# Patient Record
Sex: Female | Born: 1985 | Race: White | Hispanic: No | Marital: Married | Smoking: Never smoker
Health system: Southern US, Community
[De-identification: ages and names within clinical notes are randomized; demographics above are authoritative.]

## PROBLEM LIST (undated history)

## (undated) DIAGNOSIS — Z5189 Encounter for other specified aftercare: Secondary | ICD-10-CM

## (undated) DIAGNOSIS — K219 Gastro-esophageal reflux disease without esophagitis: Secondary | ICD-10-CM

## (undated) DIAGNOSIS — D649 Anemia, unspecified: Secondary | ICD-10-CM

## (undated) DIAGNOSIS — F419 Anxiety disorder, unspecified: Secondary | ICD-10-CM

## (undated) DIAGNOSIS — R011 Cardiac murmur, unspecified: Secondary | ICD-10-CM

## (undated) HISTORY — DX: Gastro-esophageal reflux disease without esophagitis: K21.9

## (undated) HISTORY — DX: Cardiac murmur, unspecified: R01.1

## (undated) HISTORY — DX: Anxiety disorder, unspecified: F41.9

---

## 2008-09-14 HISTORY — PX: BREAST SURGERY: SHX581

## 2016-12-10 ENCOUNTER — Ambulatory Visit (INDEPENDENT_AMBULATORY_CARE_PROVIDER_SITE_OTHER): Payer: 59 | Admitting: Family Medicine

## 2016-12-10 ENCOUNTER — Encounter: Payer: Self-pay | Admitting: Family Medicine

## 2016-12-10 ENCOUNTER — Encounter (INDEPENDENT_AMBULATORY_CARE_PROVIDER_SITE_OTHER): Payer: Self-pay

## 2016-12-10 VITALS — BP 118/78 | HR 78 | Temp 98.9°F | Resp 16 | Ht 66.0 in | Wt 221.1 lb

## 2016-12-10 DIAGNOSIS — F411 Generalized anxiety disorder: Secondary | ICD-10-CM | POA: Diagnosis not present

## 2016-12-10 DIAGNOSIS — Z7689 Persons encountering health services in other specified circumstances: Secondary | ICD-10-CM

## 2016-12-10 DIAGNOSIS — K219 Gastro-esophageal reflux disease without esophagitis: Secondary | ICD-10-CM | POA: Insufficient documentation

## 2016-12-10 DIAGNOSIS — E663 Overweight: Secondary | ICD-10-CM | POA: Diagnosis not present

## 2016-12-10 MED ORDER — PANTOPRAZOLE SODIUM 40 MG PO TBEC: 40.0000 mg | DELAYED_RELEASE_TABLET | Freq: Every day | ORAL | 3 refills | Status: DC

## 2016-12-10 NOTE — Progress Notes (Signed)
Chief Complaint  Patient presents with  . Establish Care   New to establish Complains of chronic GERD Didn't respond to omeprazole or ranitidine Desires referral to GI  Up to date with TdaP and PE in December.  Pap in 2016. Needs yearly flu shots, discussed Recently started exercise to lose weight Desires fertility.  Married about a year ago.   Patient Active Problem List   Diagnosis Date Noted  . Anxiety, generalized 12/10/2016  . Acid reflux 12/10/2016  . Overweight 12/10/2016    Outpatient Encounter Prescriptions as of 12/10/2016  Medication Sig  . sertraline (ZOLOFT) 50 MG tablet Take 50 mg by mouth daily.  . pantoprazole (PROTONIX) 40 MG tablet Take 1 tablet (40 mg total) by mouth daily.   No facility-administered encounter medications on file as of 12/10/2016.     Past Medical History:  Diagnosis Date  . Anxiety   . GERD (gastroesophageal reflux disease)   . Heart murmur     Past Surgical History:  Procedure Laterality Date  . BREAST SURGERY  2010   reduction    Social History   Social History  . Marital status: Married    Spouse name: Ethelene Browns  . Number of children: 0  . Years of education: 14   Occupational History  . branch Psychologist, [employer] and [employer]   Social History Main Topics  . Smoking status: Never Smoker  . Smokeless tobacco: Never Used  . Alcohol use Yes     Comment: occasionally  . Drug use: No  . Sexual activity: Yes    Birth control/ protection: None     Comment: pregnancy OK   Other Topics Concern  . 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 Asthma Mother     childhood  . Cancer Mother     skin cancer  . Hyperlipidemia Father   . Arthritis Maternal Grandmother   . Glaucoma Maternal Grandmother   . Heart disease Maternal Grandfather   . Hyperlipidemia  Maternal Grandfather   . Hypertension Maternal Grandfather   . Stroke Maternal Grandfather   . Cancer Maternal Grandfather     skin  . Cancer Paternal Grandmother     pancreatic  . Cancer Paternal Grandfather 40    Review of Systems  Constitutional: Negative for chills, fever and weight loss.  HENT: Negative for congestion and hearing loss.   Eyes: Negative for blurred vision and pain.  Respiratory: Negative for cough and shortness of breath.   Cardiovascular: Negative for chest pain and leg swelling.  Gastrointestinal: Positive for heartburn. Negative for abdominal pain, constipation and diarrhea.  Genitourinary: Negative for dysuria and frequency.  Musculoskeletal: Negative for falls, joint pain and myalgias.  Neurological: Negative for dizziness, seizures and headaches.  Psychiatric/Behavioral: Negative for depression. The patient is not nervous/anxious and does not have insomnia.     BP 118/78 (BP Location: Right Arm, Patient Position: Sitting, Cuff Size: Normal)   Pulse 78   Temp 98.9 F (37.2 C) (Temporal)   Resp 16   Ht 5\' 6"  (1.676 m)   Wt 221 lb 1.3 oz (100.3 kg)   LMP 12/10/2016 (Exact Date)   SpO2 99%   BMI 35.68 kg/m   Physical Exam  Constitutional: She is  oriented to person, place, and time. She appears well-developed and well-nourished.  HENT:  Head: Normocephalic and atraumatic.  Right Ear: External ear normal.  Left Ear: External ear normal.  Mouth/Throat: Oropharynx is clear and moist.  Eyes: Conjunctivae are normal. Pupils are equal, round, and reactive to light.  Neck: Normal range of motion. Neck supple. No thyromegaly present.  Cardiovascular: Normal rate, regular rhythm and normal heart sounds.   Pulmonary/Chest: Effort normal and breath sounds normal. No respiratory distress.  Abdominal: Soft. Bowel sounds are normal.  Musculoskeletal: Normal range of motion. She exhibits no edema.  Lymphadenopathy:    She has no cervical adenopathy.    Neurological: She is alert and oriented to person, place, and time.  Gait normal  Skin: Skin is warm and dry.  Psychiatric: She has a normal mood and affect. Her behavior is normal. Thought content normal.  Nursing note and vitals reviewed.  ASSESSMENT/PLAN:  1. Anxiety, generalized Continue zoloft  2. Gastroesophageal reflux disease, esophagitis presence not specified Trial of protonix - Ambulatory referral to Gastroenterology  3. Overweight Diet and exercise discussed with goal to lose 1-2 pounds a week.  4. Encounter to establish care with new doctor - CBC - Comprehensive metabolic panel - Lipid panel - VITAMIN D 25 Hydroxy (Vit-D Deficiency, Fractures) - Urinalysis, Routine w reflex microscopic   Patient Instructions  I have placed referral to GI doctor for acid reflux I have prescribed protonix (pantoprazole) to take once a day Take on empty stomach in the morning  Continue to eat well and exercise  I do recommend yearly flu shots Need physical and labs in December  Call sooner for problems  Need old records , please   Eustace MooreYvonne Sue Candy Leverett, MD

## 2016-12-10 NOTE — Patient Instructions (Addendum)
I have placed referral to GI doctor for acid reflux I have prescribed protonix (pantoprazole) to take once a day Take on empty stomach in the morning  Continue to eat well and exercise  I do recommend yearly flu shots Need physical and labs in December  Call sooner for problems  Need old records , please

## 2016-12-21 ENCOUNTER — Encounter: Payer: Self-pay | Admitting: Internal Medicine

## 2017-01-06 ENCOUNTER — Other Ambulatory Visit: Payer: Self-pay

## 2017-01-06 ENCOUNTER — Encounter: Payer: Self-pay | Admitting: Nurse Practitioner

## 2017-01-06 ENCOUNTER — Ambulatory Visit (INDEPENDENT_AMBULATORY_CARE_PROVIDER_SITE_OTHER): Payer: 59 | Admitting: Nurse Practitioner

## 2017-01-06 VITALS — BP 129/73 | HR 71 | Temp 98.4°F | Ht 66.5 in | Wt 226.6 lb

## 2017-01-06 DIAGNOSIS — K5900 Constipation, unspecified: Secondary | ICD-10-CM | POA: Diagnosis not present

## 2017-01-06 DIAGNOSIS — K219 Gastro-esophageal reflux disease without esophagitis: Secondary | ICD-10-CM

## 2017-01-06 DIAGNOSIS — R1310 Dysphagia, unspecified: Secondary | ICD-10-CM | POA: Diagnosis not present

## 2017-01-06 NOTE — Progress Notes (Signed)
Primary Care Physician:  Eustace Moore, MD Primary Gastroenterologist:  Dr. Jena Gauss  Chief Complaint  Patient presents with  . Gastroesophageal Reflux  . Constipation    some blood when wipes  . Abdominal Pain    mid upper abd  . Nausea    HPI:   Rachel Roy is a 31 y.o. female who presents on referral from primary care for GERD. The patient was last seen by PCP on 12/10/2016. At that time she was establishing care with a new primary care and complained of chronic GERD symptoms not responding to omeprazole or ranitidine. She was given a trial of Protonix and referred to GI. No record of colonoscopy or endoscopy in our system.  Today she states she's doing well overall. Protonix is working better, but not 100% effective and becomes symptomatic if she misses a day. Has identified some triggers such as coffee which causes breakthrough even if she takes PPI: coffee, ETOH, fried food. Rarely takes NSAIDs; denies ASA powders. Reflux symptoms include persistently clearing her throat, esophageal burning and regurgitation,epigastric pain; worse with laying flat or bending forward. Has solid food dysphagia, no pill dysphagia. Eats last meal of the day around 6 pm, lays down by 9 pm. Symptoms also with nausea. Has constipation and bloating, bowel movement about every 2-3 days; feels like she needs to go "all the time but can't." When she does have a BM stools are hard and require straining. Occasionally takes MiraLAX which isn't very effective. Has a lot of stress in her life. Denies lower abdominal pain. Notes scant toilet tissue hematochezia 8/10 times if she has constipation and hard stools. Has been told she has hemorrhoids and when she has a hard stool she has rectal irritation and pain. Denies chest pain, dyspnea, dizziness, lightheadedness, syncope, near syncope. Denies any other upper or lower GI symptoms.  Past Medical History:  Diagnosis Date  . Anxiety   . GERD (gastroesophageal  reflux disease)   . Heart murmur     Past Surgical History:  Procedure Laterality Date  . BREAST SURGERY  2010   reduction    Current Outpatient Prescriptions  Medication Sig Dispense Refill  . pantoprazole (PROTONIX) 40 MG tablet Take 1 tablet (40 mg total) by mouth daily. 30 tablet 3  . sertraline (ZOLOFT) 50 MG tablet Take 50 mg by mouth daily.     No current facility-administered medications for this visit.     Allergies as of 01/06/2017  . (No Known Allergies)    Family History  Problem Relation Age of Onset  . Asthma Mother     childhood  . Cancer Mother     skin cancer  . Hyperlipidemia Father   . Arthritis Maternal Grandmother   . Glaucoma Maternal Grandmother   . Heart disease Maternal Grandfather   . Hyperlipidemia Maternal Grandfather   . Hypertension Maternal Grandfather   . Stroke Maternal Grandfather   . Cancer Maternal Grandfather     skin  . Cancer Paternal Grandmother     pancreatic  . Cancer Paternal Grandfather 50  . Colon cancer Neg Hx     Social History   Social History  . Marital status: Married    Spouse name: Ethelene Browns  . Number of children: 0  . Years of education: 14   Occupational History  . branch Psychologist, educational and trust   Social History Main Topics  . Smoking status: Never Smoker  . Smokeless tobacco: Never Used  .  Alcohol use Yes     Comment: occasionally  . Drug use: No  . Sexual activity: Yes    Birth control/ protection: None     Comment: pregnancy OK   Other Topics Concern  . 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 ENT: Negative for hoarseness, difficulty swallowing. CV: Negative for chest pain, angina, palpitations, peripheral edema.  Respiratory: Negative for dyspnea at rest, cough, sputum,  wheezing.  GI: See history of present illness.  MS: Negative for joint pain, low back pain.  Derm: Negative for rash or itching.  Endo: Negative for unusual weight change.  Heme: Negative for bruising or bleeding. Allergy: Negative for rash or hives.    Physical Exam: BP 129/73   Pulse 71   Temp 98.4 F (36.9 C) (Oral)   Ht 5' 6.5" (1.689 m)   Wt 226 lb 9.6 oz (102.8 kg)   LMP 12/10/2016 (Exact Date)   BMI 36.03 kg/m  General:   Alert and oriented. Pleasant and cooperative. Well-nourished and well-developed.  Head:  Normocephalic and atraumatic. Eyes:  Without icterus, sclera clear and conjunctiva pink.  Ears:  Normal auditory acuity. Cardiovascular:  S1, S2 present without murmurs appreciated. Extremities without clubbing or edema. Respiratory:  Clear to auscultation bilaterally. No wheezes, rales, or rhonchi. No distress.  Gastrointestinal:  +BS, rounded but soft, non-tender and non-distended. No HSM noted. No guarding or rebound. No masses appreciated.  Rectal:  Deferred  Musculoskalatal:  Symmetrical without gross deformities. Neurologic:  Alert and oriented x4;  grossly normal neurologically. Psych:  Alert and cooperative. Normal mood and affect. Heme/Lymph/Immune: No excessive bruising noted.    01/06/2017 8:41 AM   Disclaimer: This note was dictated with voice recognition software. Similar sounding words can inadvertently be transcribed and may not be corrected upon review.

## 2017-01-06 NOTE — Progress Notes (Signed)
cc'ed to pcp °

## 2017-01-06 NOTE — Patient Instructions (Addendum)
1. Stop taking Protonix for now. 2. Stop taking MiraLAX and all other laxatives for now. 3. I'm giving the samples of Dexilant 60 mg. Take this once a day, on an empty stomach, first thing in the morning. 4. I am also giving you Amitiza 24 g. Take this twice a day, with food to prevent nausea. 5. I'm giving you samples to last 2 weeks for both. Call us in 1-2 weeks and let us know how both of these medicines are working. 6. We will schedule your upper endoscopy with possible dilation for you. 7. Return for follow-up in 3 months. 8. If your constipation improves which are still having some blood with her bowel movements we may need to discuss colonoscopy     Heartburn Heartburn is a type of pain or discomfort that can happen in the throat or chest. It is often described as a burning pain. It may also cause a bad taste in the mouth. Heartburn may feel worse when you lie down or bend over, and it is often worse at night. Heartburn may be caused by stomach contents that move back up into the esophagus (reflux). Follow these instructions at home: Take these actions to decrease your discomfort and to help avoid complications. Diet   Follow a diet as recommended by your health care provider. This may involve avoiding foods and drinks such as:  Coffee and tea (with or without caffeine).  Drinks that contain alcohol.  Energy drinks and sports drinks.  Carbonated drinks or sodas.  Chocolate and cocoa.  Peppermint and mint flavorings.  Garlic and onions.  Horseradish.  Spicy and acidic foods, including peppers, chili powder, curry powder, vinegar, hot sauces, and barbecue sauce.  Citrus fruit juices and citrus fruits, such as oranges, lemons, and limes.  Tomato-based foods, such as red sauce, chili, salsa, and pizza with red sauce.  Fried and fatty foods, such as donuts, french fries, potato chips, and high-fat dressings.  High-fat meats, such as hot dogs and fatty cuts of red and  white meats, such as rib eye steak, sausage, ham, and bacon.  High-fat dairy items, such as whole milk, butter, and cream cheese.  Eat small, frequent meals instead of large meals.  Avoid drinking large amounts of liquid with your meals.  Avoid eating meals during the 2-3 hours before bedtime.  Avoid lying down right after you eat.  Do not exercise right after you eat. General instructions   Pay attention to any changes in your symptoms.  Take over-the-counter and prescription medicines only as told by your health care provider. Do not take aspirin, ibuprofen, or other NSAIDs unless your health care provider told you to do so.  Do not use any tobacco products, including cigarettes, chewing tobacco, and e-cigarettes. If you need help quitting, ask your health care provider.  Wear loose-fitting clothing. Do not wear anything tight around your waist that causes pressure on your abdomen.  Raise (elevate) the head of your bed about 6 inches (15 cm).  Try to reduce your stress, such as with yoga or meditation. If you need help reducing stress, ask your health care provider.  If you are overweight, reduce your weight to an amount that is healthy for you. Ask your health care provider for guidance about a safe weight loss goal.  Keep all follow-up visits as told by your health care provider. This is important. Contact a health care provider if:  You have new symptoms.  You have unexplained weight loss.  You  have difficulty swallowing, or it hurts to swallow.  You have wheezing or a persistent cough.  Your symptoms do not improve with treatment.  You have frequent heartburn for more than two weeks. Get help right away if:  You have pain in your arms, neck, jaw, teeth, or back.  You feel sweaty, dizzy, or light-headed.  You have chest pain or shortness of breath.  You vomit and your vomit looks like blood or coffee grounds.  Your stool is bloody or black. This information  is not intended to replace advice given to you by your health care provider. Make sure you discuss any questions you have with your health care provider. Document Released: 01/17/2009 Document Revised: 02/06/2016 Document Reviewed: 12/26/2014 Elsevier Interactive Patient Education  2017 [employer].     Food Choices for Gastroesophageal Reflux Disease, Adult When you have gastroesophageal reflux disease (GERD), the foods you eat and your eating habits are very important. Choosing the right foods can help ease the discomfort of GERD. Consider working with a diet and nutrition specialist (dietitian) to help you make healthy food choices. What general guidelines should I follow? Eating plan   Choose healthy foods low in fat, such as fruits, vegetables, whole grains, low-fat dairy products, and lean meat, fish, and poultry.  Eat frequent, small meals instead of three large meals each day. Eat your meals slowly, in a relaxed setting. Avoid bending over or lying down until 2-3 hours after eating.  Limit high-fat foods such as fatty meats or fried foods.  Limit your intake of oils, butter, and shortening to less than 8 teaspoons each day.  Avoid the following:  Foods that cause symptoms. These may be different for different people. Keep a food diary to keep track of foods that cause symptoms.  Alcohol.  Drinking large amounts of liquid with meals.  Eating meals during the 2-3 hours before bed.  Cook foods using methods other than frying. This may include baking, grilling, or broiling. Lifestyle    Maintain a healthy weight. Ask your health care provider what weight is healthy for you. If you need to lose weight, work with your health care provider to do so safely.  Exercise for at least 30 minutes on 5 or more days each week, or as told by your health care provider.  Avoid wearing clothes that fit tightly around your waist and chest.  Do not use any products that contain nicotine  or tobacco, such as cigarettes and e-cigarettes. If you need help quitting, ask your health care provider.  Sleep with the head of your bed raised. Use a wedge under the mattress or blocks under the bed frame to raise the head of the bed. What foods are not recommended? The items listed may not be a complete list. Talk with your dietitian about what dietary choices are best for you. Grains  Pastries or quick breads with added fat. [place] toast. Vegetables  Deep fried vegetables. [place] fries. Any vegetables prepared with added fat. Any vegetables that cause symptoms. For some people this may include tomatoes and tomato products, chili peppers, onions and garlic, and horseradish. Fruits  Any fruits prepared with added fat. Any fruits that cause symptoms. For some people this may include citrus fruits, such as oranges, grapefruit, pineapple, and lemons. Meats and other protein foods  High-fat meats, such as fatty beef or pork, hot dogs, ribs, ham, sausage, salami and bacon. Fried meat or protein, including fried fish and fried chicken. Nuts and nut butters.  Dairy  Whole milk and chocolate milk. Sour cream. Cream. Ice cream. Cream cheese. Milk shakes. Beverages  Coffee and tea, with or without caffeine. Carbonated beverages. Sodas. Energy drinks. Fruit juice made with acidic fruits (such as orange or grapefruit). Tomato juice. Alcoholic drinks. Fats and oils  Butter. Margarine. Shortening. Ghee. Sweets and desserts  Chocolate and cocoa. Donuts. Seasoning and other foods  Pepper. Peppermint and spearmint. Any condiments, herbs, or seasonings that cause symptoms. For some people, this may include curry, hot sauce, or vinegar-based salad dressings. Summary  When you have gastroesophageal reflux disease (GERD), food and lifestyle choices are very important to help ease the discomfort of GERD.  Eat frequent, small meals instead of three large meals each day. Eat your meals slowly, in a relaxed  setting. Avoid bending over or lying down until 2-3 hours after eating.  Limit high-fat foods such as fatty meat or fried foods. This information is not intended to replace advice given to you by your health care provider. Make sure you discuss any questions you have with your health care provider. Document Released: 08/31/2005 Document Revised: 09/01/2016 Document Reviewed: 09/01/2016 Elsevier Interactive Patient Education  2017 [employer].

## 2017-01-18 ENCOUNTER — Telehealth: Payer: Self-pay | Admitting: Internal Medicine

## 2017-01-18 DIAGNOSIS — K219 Gastro-esophageal reflux disease without esophagitis: Secondary | ICD-10-CM

## 2017-01-18 DIAGNOSIS — K5904 Chronic idiopathic constipation: Secondary | ICD-10-CM

## 2017-01-18 NOTE — Telephone Encounter (Signed)
Pt was seen recently and given samples of Amitiza and Dexilant. [facility] is [facility].

## 2017-01-18 NOTE — Telephone Encounter (Signed)
Routing to EG 

## 2017-01-19 MED ORDER — LUBIPROSTONE 24 MCG PO CAPS: 24.0000 ug | ORAL_CAPSULE | Freq: Two times a day (BID) | ORAL | 3 refills | Status: DC

## 2017-01-19 MED ORDER — DEXLANSOPRAZOLE 60 MG PO CPDR
60.0000 mg | DELAYED_RELEASE_CAPSULE | Freq: Every day | ORAL | 3 refills | Status: DC
Start: 2017-01-19 — End: 2017-03-19

## 2017-01-19 NOTE — Telephone Encounter (Signed)
Please tell the patient Rx for both sent to the pharmacy as per patient request.

## 2017-01-19 NOTE — Addendum Note (Signed)
Addended by: Delane GingerGILL, Cerys Winget A on: 01/19/2017 05:44 PM   Modules accepted: Orders

## 2017-01-20 NOTE — Telephone Encounter (Signed)
I tried to call the patient, no answer,lmom 

## 2017-01-20 NOTE — Telephone Encounter (Signed)
Pt called back and she is aware the the RX has been called into the pharmacy

## 2017-01-25 ENCOUNTER — Telehealth: Payer: Self-pay

## 2017-01-25 MED ORDER — SERTRALINE HCL 50 MG PO TABS: 50.0000 mg | ORAL_TABLET | Freq: Every day | ORAL | 1 refills | Status: DC

## 2017-01-25 NOTE — Telephone Encounter (Signed)
Wants refill on zoloft

## 2017-02-09 NOTE — Patient Instructions (Signed)
Rachel Roy  02/09/2017     @PREFPERIOPPHARMACY @   Your procedure is scheduled on 02/18/2017.  Report to [facility] at 8:15 A.M.  Call this number if you have problems the morning of surgery:  [phone]   Remember:  Do not eat food or drink liquids after midnight.  Take these medicines the morning of surgery with A SIP OF WATER Dexilant, OR Protonix, Zoloft, Amtiza   Do not wear jewelry, make-up or nail polish.  Do not wear lotions, powders, or perfumes, or deoderant.  Do not shave 48 hours prior to surgery.  Men may shave face and neck.  Do not bring valuables to the hospital.  [facility] is not responsible for any belongings or valuables.  Contacts, dentures or bridgework may not be worn into surgery.  Leave your suitcase in the car.  After surgery it may be brought to your room.  For patients admitted to the hospital, discharge time will be determined by your treatment team.  Patients discharged the day of surgery will not be allowed to drive home.    Please read over the following fact sheets that you were given. Anesthesia Post-op Instructions     PATIENT INSTRUCTIONS POST-ANESTHESIA  IMMEDIATELY FOLLOWING SURGERY:  Do not drive or operate machinery for the first twenty four hours after surgery.  Do not make any important decisions for twenty four hours after surgery or while taking narcotic pain medications or sedatives.  If you develop intractable nausea and vomiting or a severe headache please notify your doctor immediately.  FOLLOW-UP:  Please make an appointment with your surgeon as instructed. You do not need to follow up with anesthesia unless specifically instructed to do so.  WOUND CARE INSTRUCTIONS (if applicable):  Keep a dry clean dressing on the anesthesia/puncture wound site if there is drainage.  Once the wound has quit draining you may leave it open to air.  Generally you should leave the bandage intact for twenty four hours unless there is  drainage.  If the epidural site drains for more than 36-48 hours please call the anesthesia department.  QUESTIONS?:  Please feel free to call your physician or the hospital operator if you have any questions, and they will be happy to assist you.      Esophagogastroduodenoscopy Esophagogastroduodenoscopy (EGD) is a procedure to examine the lining of the esophagus, stomach, and first part of the small intestine (duodenum). This procedure is done to check for problems such as inflammation, bleeding, ulcers, or growths. During this procedure, a long, flexible, lighted tube with a camera attached (endoscope) is inserted down the throat. Tell a health care provider about:  Any allergies you have.  All medicines you are taking, including vitamins, herbs, eye drops, creams, and over-the-counter medicines.  Any problems you or family members have had with anesthetic medicines.  Any blood disorders you have.  Any surgeries you have had.  Any medical conditions you have.  Whether you are pregnant or may be pregnant. What are the risks? Generally, this is a safe procedure. However, problems may occur, including:  Infection.  Bleeding.  A tear (perforation) in the esophagus, stomach, or duodenum.  Trouble breathing.  Excessive sweating.  Spasms of the larynx.  A slowed heartbeat.  Low blood pressure. What happens before the procedure?  Follow instructions from your health care provider about eating or drinking restrictions.  Ask your health care provider about:  Changing or stopping your regular medicines. This is especially important if you are  taking diabetes medicines or blood thinners.  Taking medicines such as aspirin and ibuprofen. These medicines can thin your blood. Do not take these medicines before your procedure if your health care provider instructs you not to.  Plan to have someone take you home after the procedure.  If you wear dentures, be ready to remove them  before the procedure. What happens during the procedure?  To reduce your risk of infection, your health care team will wash or sanitize their hands.  An IV tube will be put in a vein in your hand or arm. You will get medicines and fluids through this tube.  You will be given one or more of the following:  A medicine to help you relax (sedative).  A medicine to numb the area (local anesthetic). This medicine may be sprayed into your throat. It will make you feel more comfortable and keep you from gagging or coughing during the procedure.  A medicine for pain.  A mouth guard may be placed in your mouth to protect your teeth and to keep you from biting on the endoscope.  You will be asked to lie on your left side.  The endoscope will be lowered down your throat into your esophagus, stomach, and duodenum.  Air will be put into the endoscope. This will help your health care provider see better.  The lining of your esophagus, stomach, and duodenum will be examined.  Your health care provider may:  Take a tissue sample so it can be looked at in a lab (biopsy).  Remove growths.  Remove objects (foreign bodies) that are stuck.  Treat any bleeding with medicines or other devices that stop tissue from bleeding.  Widen (dilate) or stretch narrowed areas of your esophagus and stomach.  The endoscope will be taken out. The procedure may vary among health care providers and hospitals. What happens after the procedure?  Your blood pressure, heart rate, breathing rate, and blood oxygen level will be monitored often until the medicines you were given have worn off.  Do not eat or drink anything until the numbing medicine has worn off and your gag reflex has returned. This information is not intended to replace advice given to you by your health care provider. Make sure you discuss any questions you have with your health care provider. Document Released: 01/01/2005 Document Revised:  02/06/2016 Document Reviewed: 07/25/2015 Elsevier Interactive Patient Education  2017 [employer] Esophageal Dilatation Esophageal dilatation is a procedure to open a blocked or narrowed part of the esophagus. The esophagus is the long tube in your throat that carries food and liquid from your mouth to your stomach. The procedure is also called esophageal dilation. You may need this procedure if you have a buildup of scar tissue in your esophagus that makes it difficult, painful, or even impossible to swallow. This can be caused by gastroesophageal reflux disease (GERD). In rare cases, people need this procedure because they have cancer of the esophagus or a problem with the way food moves through the esophagus. Sometimes you may need to have another dilatation to enlarge the opening of the esophagus gradually. Tell a health care provider about:  Any allergies you have.  All medicines you are taking, including vitamins, herbs, eye drops, creams, and over-the-counter medicines.  Any problems you or family members have had with anesthetic medicines.  Any blood disorders you have.  Any surgeries you have had.  Any medical conditions you have.  Any antibiotic medicines you are required to  take before dental procedures. What are the risks? Generally, this is a safe procedure. However, problems can occur and include:  Bleeding from a tear in the lining of the esophagus.  A hole (perforation) in the esophagus. What happens before the procedure?  Do not eat or drink anything after midnight on the night before the procedure or as directed by your health care provider.  Ask your health care provider about changing or stopping your regular medicines. This is especially important if you are taking diabetes medicines or blood thinners.  Plan to have someone take you home after the procedure. What happens during the procedure?  You will be given a medicine that makes you relaxed and sleepy  (sedative).  A medicine may be sprayed or gargled to numb the back of the throat.  Your health care provider can use various instruments to do an esophageal dilatation. During the procedure, the instrument used will be placed in your mouth and passed down into your esophagus. Options include:  Simple dilators. This instrument is carefully placed in the esophagus to stretch it.  Guided wire bougies. In this method, a flexible tube (endoscope) is used to insert a wire into the esophagus. The dilator is passed over this wire to enlarge the esophagus. Then the wire is removed.  Balloon dilators. An endoscope with a small balloon at the end is passed down into the esophagus. Inflating the balloon gently stretches the esophagus and opens it up. What happens after the procedure?  Your blood pressure, heart rate, breathing rate, and blood oxygen level will be monitored often until the medicines you were given have worn off.  Your throat may feel slightly sore and will probably still feel numb. This will improve slowly over time.  You will not be allowed to eat or drink until the throat numbness has resolved.  If this is a same-day procedure, you may be allowed to go home once you have been able to drink, urinate, and sit on the edge of the bed without nausea or dizziness.  If this is a same-day procedure, you should have a friend or family member with you for the next 24 hours after the procedure. This information is not intended to replace advice given to you by your health care provider. Make sure you discuss any questions you have with your health care provider. Document Released: 10/22/2005 Document Revised: 02/06/2016 Document Reviewed: 01/10/2014 Elsevier Interactive Patient Education  2017 [employer].

## 2017-02-11 ENCOUNTER — Telehealth: Payer: Self-pay | Admitting: Internal Medicine

## 2017-02-11 NOTE — Telephone Encounter (Signed)
Called pt. She needs to cancel EGD d/t her husband will be out of town. Informed pt she will need to have OV to reschedule EGD. OV scheduled with AB for 03/19/17. LMOVM and informed Endo scheduler to cancel procedure.

## 2017-02-11 NOTE — Telephone Encounter (Signed)
Noted  

## 2017-02-11 NOTE — Telephone Encounter (Signed)
Routing to EG as FYI since he seen pt at last OV.

## 2017-02-11 NOTE — Telephone Encounter (Signed)
Pt called to reschedule her EGD for 6/7 with [facility]. Please call her back at [phone]

## 2017-02-12 ENCOUNTER — Inpatient Hospital Stay (HOSPITAL_COMMUNITY)
Admission: RE | Admit: 2017-02-12 | Discharge: 2017-02-12 | Disposition: A | Discharge status: Home / Self Care | Payer: 59 | Source: Ambulatory Visit

## 2017-02-16 ENCOUNTER — Other Ambulatory Visit (HOSPITAL_COMMUNITY): Payer: 59

## 2017-02-18 ENCOUNTER — Ambulatory Visit (HOSPITAL_COMMUNITY): Admission: RE | Admit: 2017-02-18 | Payer: 59 | Source: Ambulatory Visit | Admitting: Internal Medicine

## 2017-02-18 ENCOUNTER — Encounter (HOSPITAL_COMMUNITY): Admission: RE | Payer: Self-pay | Source: Ambulatory Visit

## 2017-02-18 SURGERY — ESOPHAGOGASTRODUODENOSCOPY (EGD) WITH PROPOFOL
Anesthesia: Monitor Anesthesia Care

## 2017-03-19 ENCOUNTER — Encounter: Payer: Self-pay | Admitting: Gastroenterology

## 2017-03-19 ENCOUNTER — Ambulatory Visit (INDEPENDENT_AMBULATORY_CARE_PROVIDER_SITE_OTHER): Payer: 59 | Admitting: Gastroenterology

## 2017-03-19 VITALS — BP 124/73 | HR 72 | Temp 97.6°F | Ht 67.0 in | Wt 219.2 lb

## 2017-03-19 DIAGNOSIS — K219 Gastro-esophageal reflux disease without esophagitis: Secondary | ICD-10-CM

## 2017-03-19 DIAGNOSIS — R1013 Epigastric pain: Secondary | ICD-10-CM | POA: Diagnosis not present

## 2017-03-19 MED ORDER — DEXLANSOPRAZOLE 60 MG PO CPDR: 60.0000 mg | DELAYED_RELEASE_CAPSULE | Freq: Every day | ORAL | 11 refills | Status: DC

## 2017-03-19 NOTE — Assessment & Plan Note (Signed)
31 year old female with history of dyspepsia, dysphagia, and now with resolution of symptoms after starting Dexilant and employing strict dietary/behavior modification. She has lost weight purposefully and was applauded on this. Removing gluten from her diet has resulted in significant improvement as well. She is curious about celiac disease, and I don't think it's unreasonable to check serologies as she noted significant improvement with avoiding gluten in her diet; however, the improvement could be related to multiple factors,.  We will hold off on EGD/dilation now as she is doing well. Check celiac serologies. If positive, she would need EGD with small bowel biopsy. Continue Dexilant. Return in 6 months.

## 2017-03-19 NOTE — Progress Notes (Signed)
cc'ed to pcp °

## 2017-03-19 NOTE — Progress Notes (Signed)
Referring Provider: Eustace MooreNelson, Rachel Sue, MD Primary Care Physician:  Eustace MooreNelson, Rachel Sue, MD Primary GI: Dr. Jena Gaussourk   Chief Complaint  Patient presents with  . Gastroesophageal Reflux    reschedule EGD, Dexilant helping, stopped coffee    HPI:   Rachel Roy is a 31 y.o. female presenting today with a history of chronic GERD, dyspepsia, and solid food dysphagia. She had been seen in April 2018 and EGD arranged, but she had to cancel. She is here to update H&P.   Stopped drinking coffee and noticed significant improvement in symptoms. Would feel bloated after eating, so she stopped intake of gluten. Dysphagia resolved. Dypepsia resolved. She has lost about 6-7 lbs since last seen in our office, purposefully. She has been exercising at the gym, swimming, and has just started slim fast for breakfast/lunch with a sensible dinner. She is very excited about the weight loss.   No issues with constipation. No rectal bleeding.   Past Medical History:  Diagnosis Date  . Anxiety   . GERD (gastroesophageal reflux disease)   . Heart murmur     Past Surgical History:  Procedure Laterality Date  . BREAST SURGERY  2010   reduction    Current Outpatient Prescriptions  Medication Sig Dispense Refill  . dexlansoprazole (DEXILANT) 60 MG capsule Take 1 capsule (60 mg total) by mouth daily. 30 capsule 11  . sertraline (ZOLOFT) 50 MG tablet Take 1 tablet (50 mg total) by mouth daily. 90 tablet 1   No current facility-administered medications for this visit.     Allergies as of 03/19/2017  . (No Known Allergies)    Family History  Problem Relation Age of Onset  . Asthma Mother        childhood  . Cancer Mother        skin cancer  . Hyperlipidemia Father   . Arthritis Maternal Grandmother   . Glaucoma Maternal Grandmother   . Heart disease Maternal Grandfather   . Hyperlipidemia Maternal Grandfather   . Hypertension Maternal Grandfather   . Stroke Maternal Grandfather   . Cancer  Maternal Grandfather        skin  . Cancer Paternal Grandmother        pancreatic  . Cancer Paternal Grandfather 540  . Colon cancer Neg Hx     Social History   Social History  . Marital status: Married    Spouse name: Ethelene Brownsnthony  . Number of children: 0  . Years of education: 14   Occupational History  . branch Psychologist, educationalmanager     [employer]   Social History Main Topics  . Smoking status: Never Smoker  . Smokeless tobacco: Never Used  . Alcohol use Yes     Comment: occasionally  . Drug use: No  . Sexual activity: Yes    Birth control/ protection: None     Comment: pregnancy OK   Other Topics Concern  . None   Social History Narrative   Lives with husband Ethelene Brownsnthony   [place]   [place]   2 dogs, [employer]   [employer]    Review of Systems: As mentioned in HPI.   Physical Exam: BP 124/73   Pulse 72   Temp 97.6 F (36.4 C) (Oral)   Ht 5\' 7"  (1.702 m)   Wt 219 lb 3.2 oz (99.4 kg)   LMP 02/12/2017 (Approximate)   BMI 34.33 kg/m  General:   Alert and oriented. No distress  noted. Pleasant and cooperative.  Head:  Normocephalic and atraumatic. Eyes:  Conjuctiva clear without scleral icterus. Mouth:  Oral mucosa pink and moist. Good dentition. No lesions. Abdomen:  +BS, soft, non-tender and non-distended. No rebound or guarding. No HSM or masses noted. Msk:  Symmetrical without gross deformities. Normal posture. Extremities:  Without edema. Neurologic:  Alert and  oriented x4;  grossly normal neurologically. Psych:  Alert and cooperative. Normal mood and affect.

## 2017-03-19 NOTE — Patient Instructions (Signed)
Continue Dexilant once daily.  Continue the great lifestyle changes! Great job!  I have ordered blood work to have done today.   We will see you in 6 months!

## 2017-03-30 LAB — IGA: IgA: 340 mg/dL (ref 81–463)

## 2017-03-30 LAB — TISSUE TRANSGLUTAMINASE, IGA: Tissue Transglutaminase Ab, IgA: 1 U/mL (ref <4)

## 2017-04-06 NOTE — Progress Notes (Signed)
No new recommendations. Continue excellent dietary/behavior modification. Continue Dexilant. We will see her in 6 months.

## 2017-04-07 ENCOUNTER — Ambulatory Visit: Payer: 59 | Admitting: Nurse Practitioner

## 2017-10-08 ENCOUNTER — Ambulatory Visit: Payer: 59 | Admitting: Gastroenterology

## 2017-10-26 ENCOUNTER — Other Ambulatory Visit: Payer: Self-pay

## 2017-10-26 ENCOUNTER — Other Ambulatory Visit (HOSPITAL_COMMUNITY)
Admission: RE | Admit: 2017-10-26 | Discharge: 2017-10-26 | Disposition: A | Discharge status: Home / Self Care | Payer: 59 | Source: Ambulatory Visit | Attending: Family Medicine | Admitting: Family Medicine

## 2017-10-26 ENCOUNTER — Encounter: Payer: Self-pay | Admitting: Family Medicine

## 2017-10-26 ENCOUNTER — Ambulatory Visit (INDEPENDENT_AMBULATORY_CARE_PROVIDER_SITE_OTHER): Payer: 59 | Admitting: Family Medicine

## 2017-10-26 VITALS — BP 120/84 | HR 80 | Temp 97.7°F | Resp 16 | Ht 67.0 in | Wt 231.1 lb

## 2017-10-26 DIAGNOSIS — N979 Female infertility, unspecified: Secondary | ICD-10-CM

## 2017-10-26 DIAGNOSIS — R002 Palpitations: Secondary | ICD-10-CM | POA: Insufficient documentation

## 2017-10-26 DIAGNOSIS — Z124 Encounter for screening for malignant neoplasm of cervix: Secondary | ICD-10-CM | POA: Diagnosis present

## 2017-10-26 DIAGNOSIS — F411 Generalized anxiety disorder: Secondary | ICD-10-CM | POA: Diagnosis not present

## 2017-10-26 DIAGNOSIS — K219 Gastro-esophageal reflux disease without esophagitis: Secondary | ICD-10-CM | POA: Insufficient documentation

## 2017-10-26 DIAGNOSIS — Z8249 Family history of ischemic heart disease and other diseases of the circulatory system: Secondary | ICD-10-CM

## 2017-10-26 DIAGNOSIS — Z0000 Encounter for general adult medical examination without abnormal findings: Secondary | ICD-10-CM | POA: Diagnosis not present

## 2017-10-26 MED ORDER — BUPROPION HCL ER (SR) 100 MG PO TB12: 100.0000 mg | ORAL_TABLET | Freq: Two times a day (BID) | ORAL | 3 refills | Status: DC

## 2017-10-26 NOTE — Progress Notes (Signed)
Chief Complaint  Patient presents with  . Annual Exam   Rachel Roy is here for her annual physical examination today.  In addition to needing a Pap smear, she has multiple issues to discuss with me. First, she is really bothered by anxiety and depression.  She has been on Lexapro, citalopram, Effexor, and sertraline.  She did not like any of these medications, they will made her feel like she was blunted and her feelings and expression, and had decreased sex drive.  We discussed various options.  She did declines need for counseling.  I am going to start her on Wellbutrin and see if this will help lift her spirits without causing side effects.  Discussed the importance of diet exercise and sleep, needs to take care of herself in order to feel better. She complains of increased dyspepsia.  She did not get her Dexilant filled because when she went to the pharmacy it was very expensive.  She is under the care of gastroenterology.  She is advised to call them for follow-up. She has anxiety about is that she and her husband have been married a couple years and have not gotten pregnant.  They have not use birth control.  She recently got a kit to check her basal body temperatures and keep a chart.  I told her this is a good idea.  Actually, if they have not been using birth control for 1 year period of time the need to be referred for an infertility workup. She states that heart disease runs in her family.  Her mother has cautioned her that she needs to get a heart workup.  She is never had any heart problems, heart murmur, or difficulty with exertion or activity.  There is no real heart disease at a young age.  I will get cholesterol values on her.  She states she does have some palpitations when she is upset.  She states that her heart "beats hard".  No dizziness or lightheadedness.  No chest pain or pressure.  This resolves promptly with rest.  We did an EKG on her today and and it is clearly normal.  We  talked about cardiovascular risk factors.  She currently has none, except for being mildly overweight.  She should exercise regularly.  Eat a heart healthy diet. Patient Active Problem List   Diagnosis Date Noted  . Infertility, female 10/26/2017  . Dyspepsia 03/19/2017  . Dysphagia 01/06/2017  . Constipation 01/06/2017  . Anxiety, generalized 12/10/2016  . Acid reflux 12/10/2016  . Overweight 12/10/2016    Outpatient Encounter Medications as of 10/26/2017  Medication Sig  . buPROPion (WELLBUTRIN SR) 100 MG 12 hr tablet Take 1 tablet (100 mg total) by mouth 2 (two) times daily.   No facility-administered encounter medications on file as of 10/26/2017.    No Known Allergies  Review of Systems  Constitutional: Negative for activity change, appetite change and unexpected weight change.  HENT: Negative for congestion, dental problem, postnasal drip and rhinorrhea.   Eyes: Negative for redness and visual disturbance.  Respiratory: Negative for cough and shortness of breath.   Cardiovascular: Positive for palpitations. Negative for chest pain and leg swelling.       Rapid heart when anxious  Gastrointestinal: Negative for abdominal pain, constipation and diarrhea.  Genitourinary: Positive for menstrual problem. Negative for difficulty urinating and frequency.       Periods every 30 - 45 days  Musculoskeletal: Negative for arthralgias and back pain.  Neurological: Negative  for dizziness and headaches.  Psychiatric/Behavioral: Negative for dysphoric mood and sleep disturbance. The patient is nervous/anxious.     Physical Exam  BP 120/84 (BP Location: Right Arm, Patient Position: Sitting, Cuff Size: Normal)   Pulse 80   Temp 97.7 F (36.5 C) (Temporal)   Resp 16   Ht 5' 7"  (1.702 m)   Wt 231 lb 1.3 oz (104.8 kg)   LMP 10/06/2017 (Exact Date)   SpO2 99%   BMI 36.19 kg/m   General Appearance:    Alert, cooperative, no distress, appears stated age.  Pleasant and cooperative.  Mildly  anxious.  Head:    Normocephalic, without obvious abnormality, atraumatic  Eyes:    PERRL, conjunctiva/corneas clear, EOM's intact, fundi    benign, both eyes  Ears:    Normal TM's and external ear canals, both ears  Nose:   Nares normal, septum midline, mucosa normal, no drainage    or sinus tenderness  Throat:   Lips, mucosa, and tongue normal; teeth and gums normal  Neck:   Supple, symmetrical, trachea midline, no adenopathy;    thyroid:  no enlargement/tenderness/nodules; no carotid   bruit or JVD  Back:     Symmetric, no curvature, ROM normal, no CVA tenderness  Lungs:     Clear to auscultation bilaterally, respirations unlabored  Chest Wall:    No tenderness or deformity   Heart:    Regular rate and rhythm, S1 and S2 normal, no murmur, rub   or gallop  Breast Exam:    No tenderness, masses, or nipple abnormality.scarring noted from breast reduction surgery.  Abdomen:     Soft, non-tender, bowel sounds active all four quadrants,    no masses, no organomegaly  Genitalia:    Normal female without lesion, discharge or tenderness. Nulliparous cervix.  Pap obtained.  Bimanual difficult secondary to body habitus, no mass identified.  Extremities:   Extremities normal, atraumatic, no cyanosis or edema  Pulses:   2+ and symmetric all extremities  Skin:   Skin color, texture, turgor normal, no rashes or lesions  Lymph nodes:   Cervical, supraclavicular, and axillary nodes normal  Neurologic:   CNII-XII intact, normal strength, sensation and reflexes    throughout     ASSESSMENT/PLAN:  1. Physical exam, annual No unexpected findings  2. Screening for cervical cancer Pap done - Cytology - PAP  3. Palpitations EKG is normal  4. Anxiety, generalized wellbutrin  5. Gastroesophageal reflux disease, esophagitis presence not specified Patient is continued on home Dexilant because of price.  I recommend she get some over-the-counter omeprazole for her current symptoms.  She can  follow-up with GYN for other questions.  6. Family history of heart disease None at a young age.  Concern to patient.  Will get blood work and EKG.  Patient was reassured.  7. Infertility, female Refer to infertility center   Patient Instructions  I will notify you of your test results Take the wellbutrin once a day for one week, then increase to 2 times a day EKG is normal  I will refer to fertility doctor  See me in a month to check on new medicine      Raylene Everts, MD

## 2017-10-26 NOTE — Patient Instructions (Signed)
I will notify you of your test results Take the wellbutrin once a day for one week, then increase to 2 times a day EKG is normal  I will refer to fertility doctor  See me in a month to check on new medicine

## 2017-10-28 LAB — CYTOLOGY - PAP
Diagnosis: NEGATIVE
HPV: NOT DETECTED

## 2017-11-23 ENCOUNTER — Ambulatory Visit: Payer: 59 | Admitting: Family Medicine

## 2017-11-30 ENCOUNTER — Ambulatory Visit: Payer: 59 | Admitting: Family Medicine

## 2017-12-06 ENCOUNTER — Ambulatory Visit (INDEPENDENT_AMBULATORY_CARE_PROVIDER_SITE_OTHER): Payer: 59 | Admitting: Gastroenterology

## 2017-12-06 ENCOUNTER — Other Ambulatory Visit: Payer: Self-pay

## 2017-12-06 ENCOUNTER — Encounter: Payer: Self-pay | Admitting: Gastroenterology

## 2017-12-06 ENCOUNTER — Telehealth: Payer: Self-pay

## 2017-12-06 VITALS — BP 122/75 | HR 57 | Temp 97.4°F | Ht 66.5 in | Wt 228.4 lb

## 2017-12-06 DIAGNOSIS — K5900 Constipation, unspecified: Secondary | ICD-10-CM

## 2017-12-06 DIAGNOSIS — R1310 Dysphagia, unspecified: Secondary | ICD-10-CM

## 2017-12-06 DIAGNOSIS — R1013 Epigastric pain: Secondary | ICD-10-CM

## 2017-12-06 MED ORDER — DEXLANSOPRAZOLE 60 MG PO CPDR: 60.0000 mg | DELAYED_RELEASE_CAPSULE | Freq: Every day | ORAL | 3 refills | Status: DC

## 2017-12-06 NOTE — Assessment & Plan Note (Signed)
Dilation at time of EGD.  

## 2017-12-06 NOTE — Progress Notes (Signed)
Primary Care Physician:  Eustace Moore, MD Primary GI: Dr. Jena Gauss   Chief Complaint  Patient presents with  . Gastroesophageal Reflux    HPI:   Rachel Roy is a 32 y.o. female presenting today with a history of chronic GERD, last seen in July 2018. At that time, she had lost weight through purposeful dietary and behavior modifications. GERD symptoms were well controlled on Dexilant. Previously has tried omeprazole, ranitidine, Protonix. Stopped drinking coffee last year with great improvement in symptoms.   Last time getting Dexilant refilled was 70$. Went back to omeprazole. Always feels like there is a lump in her throat and constantly clearing her throat. When eating, feels bloated afterward. Omeprazole OTC not helpful. Has a dry cough. Noting solid food dysphagia intermittently. Started drinking coffee again but only a few times a week, one cup. Trying to drink just water. Has nausea but no pain. Has a sour stomach at times, indigestion. Feels uncomfortable.   Constipation: will sometimes go a few days without a BM. Past month or two has been worse. Straining at times. Trying to drink water. Has tried Miralax before, which helped. Doesn't want to try prescription yet.    Past Medical History:  Diagnosis Date  . Anxiety   . GERD (gastroesophageal reflux disease)   . Heart murmur     Past Surgical History:  Procedure Laterality Date  . BREAST SURGERY  2010   reduction    Current Outpatient Medications  Medication Sig Dispense Refill  . buPROPion (WELLBUTRIN SR) 100 MG 12 hr tablet Take 1 tablet (100 mg total) by mouth 2 (two) times daily. 60 tablet 3  . omeprazole (PRILOSEC) 20 MG capsule Take 20 mg by mouth daily.    Marland Kitchen dexlansoprazole (DEXILANT) 60 MG capsule Take 1 capsule (60 mg total) by mouth daily. 90 capsule 3   No current facility-administered medications for this visit.     Allergies as of 12/06/2017  . (No Known Allergies)    Family History    Problem Relation Age of Onset  . Asthma Mother        childhood  . Cancer Mother        skin cancer  . Hyperlipidemia Father   . Arthritis Maternal Grandmother   . Glaucoma Maternal Grandmother   . Heart disease Maternal Grandmother   . Heart disease Maternal Grandfather        died cancer, heart at 48s  . Hyperlipidemia Maternal Grandfather   . Hypertension Maternal Grandfather   . Stroke Maternal Grandfather   . Cancer Maternal Grandfather        skin  . Cancer Paternal Grandmother        pancreatic  . Cancer Paternal Grandfather 59  . Colon cancer Neg Hx     Social History   Socioeconomic History  . Marital status: Married    Spouse name: Ethelene Browns  . Number of children: 0  . Years of education: 86  . Highest education level: Not on file  Occupational History  . Occupation: Data processing manager    Comment: [employer]  Social Needs  . Financial resource strain: Not on file  . Food insecurity:    Worry: Not on file    Inability: Not on file  . Transportation needs:    Medical: Not on file    Non-medical: Not on file  Tobacco Use  . Smoking status: Never Smoker  . Smokeless tobacco: Never Used  Substance and Sexual Activity  .  Alcohol use: Yes    Comment: occasionally  . Drug use: No  . Sexual activity: Yes    Birth control/protection: None    Comment: pregnancy OK  Lifestyle  . Physical activity:    Days per week: Not on file    Minutes per session: Not on file  . Stress: Not on file  Relationships  . Social connections:    Talks on phone: Not on file    Gets together: Not on file    Attends religious service: Not on file    Active member of club or organization: Not on file    Attends meetings of clubs or organizations: Not on file    Relationship status: Not on file  Other Topics Concern  . Not on file  Social History Narrative   Lives with husband Ethelene Brownsnthony   [place]   [place]   2 dogs, [employer]   Goes to [employer]    Review  of Systems: Gen: Denies fever, chills, anorexia. Denies fatigue, weakness, weight loss.  CV: Denies chest pain, palpitations, syncope, peripheral edema, and claudication. Resp: Denies dyspnea at rest, cough, wheezing, coughing up blood, and pleurisy. GI:see HPI  Derm: Denies rash, itching, dry skin Psych: Denies depression, anxiety, memory loss, confusion. No homicidal or suicidal ideation.  Heme: Denies bruising, bleeding, and enlarged lymph nodes.  Physical Exam: BP 122/75   Pulse (!) 57   Temp (!) 97.4 F (36.3 C) (Oral)   Ht 5' 6.5" (1.689 m)   Wt 228 lb 6.4 oz (103.6 kg)   LMP 12/06/2017   BMI 36.31 kg/m  General:   Alert and oriented. No distress noted. Pleasant and cooperative.  Head:  Normocephalic and atraumatic. Eyes:  Conjuctiva clear without scleral icterus. Mouth:  Oral mucosa pink and moist.  Lungs: Clear bilaterally Heart: S1 S2 present, regular  Abdomen:  +BS, soft, non-tender and non-distended. No rebound or guarding. No HSM or masses noted. Msk:  Symmetrical without gross deformities. Normal posture. Extremities:  Without edema. Neurologic:  Alert and  oriented x4 Psych:  Alert and cooperative. Normal mood and affect.

## 2017-12-06 NOTE — Patient Instructions (Addendum)
We have given you samples of Dexilant and a copay card. I have sent in a 90 day supply to see if the cost is the same for a 90 day and 30 day supply.  We have scheduled you for an upper endoscopy with dilation and small bowel biopsies.   Continue the dietary and behavior modifications we discussed. You can do it, because you have done it before! I know you will succeed.   For constipation: drink half your body weight in ounces. Keep water with you at all times. Add supplemental fiber such as Metamucil or Benefiber. You can use Miralax on any given evening that you have not had a bowel movement. If this does not work, let me know. I can give you samples of a prescription to try that you may like better.   We will see you after the procedure in 3-4 months!  It was a pleasure to see you today. I strive to create trusting relationships with patients to provide genuine, compassionate, and quality care. I value your feedback. If you receive a survey regarding your visit,  I greatly appreciate you taking time to fill this out.   Gelene MinkAnna W. Devera Englander, PhD, ANP-BC [facility]    Gastroesophageal Reflux Disease, Adult Normally, food travels down the esophagus and stays in the stomach to be digested. However, when a person has gastroesophageal reflux disease (GERD), food and stomach acid move back up into the esophagus. When this happens, the esophagus becomes sore and inflamed. Over time, GERD can create small holes (ulcers) in the lining of the esophagus. What are the causes? This condition is caused by a problem with the muscle between the esophagus and the stomach (lower esophageal sphincter, or LES). Normally, the LES muscle closes after food passes through the esophagus to the stomach. When the LES is weakened or abnormal, it does not close properly, and that allows food and stomach acid to go back up into the esophagus. The LES can be weakened by certain dietary substances, medicines, and  medical conditions, including:  Tobacco use.  Pregnancy.  Having a hiatal hernia.  Heavy alcohol use.  Certain foods and beverages, such as coffee, chocolate, onions, and peppermint.  What increases the risk? This condition is more likely to develop in:  People who have an increased body weight.  People who have connective tissue disorders.  People who use NSAID medicines.  What are the signs or symptoms? Symptoms of this condition include:  Heartburn.  Difficult or painful swallowing.  The feeling of having a lump in the throat.  Abitter taste in the mouth.  Bad breath.  Having a large amount of saliva.  Having an upset or bloated stomach.  Belching.  Chest pain.  Shortness of breath or wheezing.  Ongoing (chronic) cough or a night-time cough.  Wearing away of tooth enamel.  Weight loss.  Different conditions can cause chest pain. Make sure to see your health care provider if you experience chest pain. How is this diagnosed? Your health care provider will take a medical history and perform a physical exam. To determine if you have mild or severe GERD, your health care provider may also monitor how you respond to treatment. You may also have other tests, including:  An endoscopy toexamine your stomach and esophagus with a small camera.  A test thatmeasures the acidity level in your esophagus.  A test thatmeasures how much pressure is on your esophagus.  A barium swallow or modified barium swallow to show  the shape, size, and functioning of your esophagus.  How is this treated? The goal of treatment is to help relieve your symptoms and to prevent complications. Treatment for this condition may vary depending on how severe your symptoms are. Your health care provider may recommend:  Changes to your diet.  Medicine.  Surgery.  Follow these instructions at home: Diet  Follow a diet as recommended by your health care provider. This may involve  avoiding foods and drinks such as: ? Coffee and tea (with or without caffeine). ? Drinks that containalcohol. ? Energy drinks and sports drinks. ? Carbonated drinks or sodas. ? Chocolate and cocoa. ? Peppermint and mint flavorings. ? Garlic and onions. ? Horseradish. ? Spicy and acidic foods, including peppers, chili powder, curry powder, vinegar, hot sauces, and barbecue sauce. ? Citrus fruit juices and citrus fruits, such as oranges, lemons, and limes. ? Tomato-based foods, such as red sauce, chili, salsa, and pizza with red sauce. ? Fried and fatty foods, such as donuts, french fries, potato chips, and high-fat dressings. ? High-fat meats, such as hot dogs and fatty cuts of red and white meats, such as rib eye steak, sausage, ham, and bacon. ? High-fat dairy items, such as whole milk, butter, and cream cheese.  Eat small, frequent meals instead of large meals.  Avoid drinking large amounts of liquid with your meals.  Avoid eating meals during the 2-3 hours before bedtime.  Avoid lying down right after you eat.  Do not exercise right after you eat. General instructions  Pay attention to any changes in your symptoms.  Take over-the-counter and prescription medicines only as told by your health care provider. Do not take aspirin, ibuprofen, or other NSAIDs unless your health care provider told you to do so.  Do not use any tobacco products, including cigarettes, chewing tobacco, and e-cigarettes. If you need help quitting, ask your health care provider.  Wear loose-fitting clothing. Do not wear anything tight around your waist that causes pressure on your abdomen.  Raise (elevate) the head of your bed 6 inches (15cm).  Try to reduce your stress, such as with yoga or meditation. If you need help reducing stress, ask your health care provider.  If you are overweight, reduce your weight to an amount that is healthy for you. Ask your health care provider for guidance about a safe  weight loss goal.  Keep all follow-up visits as told by your health care provider. This is important. Contact a health care provider if:  You have new symptoms.  You have unexplained weight loss.  You have difficulty swallowing, or it hurts to swallow.  You have wheezing or a persistent cough.  Your symptoms do not improve with treatment.  You have a hoarse voice. Get help right away if:  You have pain in your arms, neck, jaw, teeth, or back.  You feel sweaty, dizzy, or light-headed.  You have chest pain or shortness of breath.  You vomit and your vomit looks like blood or coffee grounds.  You faint.  Your stool is bloody or black.  You cannot swallow, drink, or eat. This information is not intended to replace advice given to you by your health care provider. Make sure you discuss any questions you have with your health care provider. Document Released: 06/10/2005 Document Revised: 01/29/2016 Document Reviewed: 12/26/2014 Elsevier Interactive Patient Education  [employer].

## 2017-12-06 NOTE — Telephone Encounter (Signed)
Tried to call pt to inform of pre-op appt 01/06/18 at 12:45pm, no answer, LMOVM. Letter mailed.

## 2017-12-06 NOTE — Assessment & Plan Note (Addendum)
Pleasant 32 year old female with history of chronic GERD, nausea, now with solid food dysphagia and globus sensation. No prior EGD. Previously had done well with Dexilant, but copay has increased to the 70$ range. Weight gain and dietary behaviors exacerbating GERD as well. Persistent bloating noted. She had noted improvement in bloating with gluten-free diet in the past, and celiac serologies were negative. Will request small bowel biopsies at time of EGD to be thorough, as false negatives are possible but I feel probably unlikely in this case.   Proceed with upper endoscopy/dilation/small bowel biopsies in the near future with Dr. Jena Gaussourk. The risks, benefits, and alternatives have been discussed in detail with patient. They have stated understanding and desire to proceed.  PROPOFOL due to history of anxiety, medications  Stop omeprazole. Start Dexilant once daily Doubt bloating and nausea secondary to biliary etiology and without any biliary symptoms. However, if GERD is controlled in the future and EGD unrevealing, could consider US abdomen PREGNANCY TEST PRIOR: not on birth control.  Return in 3-4 months

## 2017-12-06 NOTE — Assessment & Plan Note (Signed)
Hesitant to try prescriptive agents. Increase water intake, add fiber. May use Miralax prn any evening she has not had a BM. Call if desires to try agent such as Linzess. 3-4 month return.

## 2017-12-07 ENCOUNTER — Telehealth: Payer: Self-pay | Admitting: Family Medicine

## 2017-12-07 ENCOUNTER — Ambulatory Visit: Payer: 59 | Admitting: Family Medicine

## 2017-12-07 NOTE — Telephone Encounter (Signed)
Called patient 2x no answer, full voicemail.  Left voicemail for Rachel Roy to R/S appt [phone]

## 2017-12-09 ENCOUNTER — Ambulatory Visit (INDEPENDENT_AMBULATORY_CARE_PROVIDER_SITE_OTHER): Payer: 59 | Admitting: Family Medicine

## 2017-12-09 ENCOUNTER — Other Ambulatory Visit: Payer: Self-pay

## 2017-12-09 ENCOUNTER — Telehealth: Payer: Self-pay | Admitting: *Deleted

## 2017-12-09 ENCOUNTER — Encounter: Payer: Self-pay | Admitting: Family Medicine

## 2017-12-09 VITALS — BP 110/68 | HR 63 | Temp 98.7°F | Resp 12 | Ht 67.0 in | Wt 227.1 lb

## 2017-12-09 DIAGNOSIS — F411 Generalized anxiety disorder: Secondary | ICD-10-CM | POA: Diagnosis not present

## 2017-12-09 LAB — LIPID PANEL
CHOLESTEROL: 186 mg/dL (ref <200)
HDL: 43 mg/dL — AB (ref >50)
LDL Cholesterol (Calc): 120 mg/dL (calc) — ABNORMAL HIGH
Non-HDL Cholesterol (Calc): 143 mg/dL (calc) — ABNORMAL HIGH (ref <130)
Total CHOL/HDL Ratio: 4.3 (calc) (ref <5.0)
Triglycerides: 123 mg/dL (ref <150)

## 2017-12-09 LAB — CBC
HCT: 37.8 % (ref 35.0–45.0)
Hemoglobin: 12.5 g/dL (ref 11.7–15.5)
MCH: 27.2 pg (ref 27.0–33.0)
MCHC: 33.1 g/dL (ref 32.0–36.0)
MCV: 82.2 fL (ref 80.0–100.0)
MPV: 8.9 fL (ref 7.5–12.5)
PLATELETS: 351 10*3/uL (ref 140–400)
RBC: 4.6 10*6/uL (ref 3.80–5.10)
RDW: 13.4 % (ref 11.0–15.0)
WBC: 6 10*3/uL (ref 3.8–10.8)

## 2017-12-09 LAB — COMPLETE METABOLIC PANEL WITH GFR
AG RATIO: 1.6 (calc) (ref 1.0–2.5)
ALBUMIN MSPROF: 4.5 g/dL (ref 3.6–5.1)
ALT: 12 U/L (ref 6–29)
AST: 14 U/L (ref 10–30)
Alkaline phosphatase (APISO): 68 U/L (ref 33–115)
BILIRUBIN TOTAL: 0.4 mg/dL (ref 0.2–1.2)
BUN: 12 mg/dL (ref 7–25)
CHLORIDE: 105 mmol/L (ref 98–110)
CO2: 28 mmol/L (ref 20–32)
Calcium: 9.3 mg/dL (ref 8.6–10.2)
Creat: 0.79 mg/dL (ref 0.50–1.10)
GFR, EST AFRICAN AMERICAN: 116 mL/min/{1.73_m2} (ref >=60)
GFR, Est Non African American: 100 mL/min/{1.73_m2} (ref >=60)
GLUCOSE: 85 mg/dL (ref 65–99)
Globulin: 2.9 g/dL (calc) (ref 1.9–3.7)
POTASSIUM: 4.2 mmol/L (ref 3.5–5.3)
Sodium: 140 mmol/L (ref 135–146)
TOTAL PROTEIN: 7.4 g/dL (ref 6.1–8.1)

## 2017-12-09 MED ORDER — BUSPIRONE HCL 7.5 MG PO TABS: 7.5000 mg | ORAL_TABLET | Freq: Three times a day (TID) | ORAL | 0 refills | Status: DC

## 2017-12-09 NOTE — Telephone Encounter (Signed)
Patient called in stating the Dexilant was too expensive. She is requesting alternative. She was provided with samples at [facility] on 12/06/17. Please advise AB thanks

## 2017-12-09 NOTE — Progress Notes (Signed)
Chief Complaint  Patient presents with  . Follow-up    pt stopped Wellbutrin, didn't like the way it made her feel  Patient is here for follow-up.  Asked her to come in in 1 month to discuss how she was feeling on Wellbutrin.  She has anxiety.  She has not tolerated SSRIs in the past.  I thought Wellbutrin might be a good choice for her.  After taking it for a couple of weeks she states it actually made her feel worse, more cheerful, and more depressed.  She stopped the medication.  She still feels anxious and upset easily.  She requests a referral for counseling.  She states this helped her when she was a teenager.  I think this is a good idea.  In the meantime I am also going to give her something to help with her anxiety.  She states she used BuSpar as a teenager and thinks it helped her.  She only use it for short period of time.  She is fairly certain she did not have side effects.  We discussed the sometimes people use benzodiazepines for short-term anxiety.  These can be habit-forming and are not good long-term medicines.  This would be a second choice if the BuSpar does not work.  Hopefully the psychology/psychiatry doctor can find a medicine that will help her long-term.  She has not yet seen a fertility doctor.  Has not yet had her blood work.  She is reminded that these are due   Patient Active Problem List   Diagnosis Date Noted  . Infertility, female 10/26/2017  . Dyspepsia 03/19/2017  . Dysphagia 01/06/2017  . Constipation 01/06/2017  . Anxiety, generalized 12/10/2016  . Acid reflux 12/10/2016  . Overweight 12/10/2016    Outpatient Encounter Medications as of 12/09/2017  Medication Sig  . dexlansoprazole (DEXILANT) 60 MG capsule Take 1 capsule (60 mg total) by mouth daily.  Marland Kitchen omeprazole (PRILOSEC) 20 MG capsule Take 20 mg by mouth daily.  Marland Kitchen buPROPion (WELLBUTRIN SR) 100 MG 12 hr tablet Take 1 tablet (100 mg total) by mouth 2 (two) times daily. (Patient not taking: Reported on  12/09/2017)  . busPIRone (BUSPAR) 7.5 MG tablet Take 1 tablet (7.5 mg total) by mouth 3 (three) times daily.   No facility-administered encounter medications on file as of 12/09/2017.     No Known Allergies  Review of Systems  Constitutional: Negative for activity change, appetite change and unexpected weight change.  HENT: Negative for congestion, dental problem, postnasal drip and rhinorrhea.   Eyes: Negative for redness and visual disturbance.  Respiratory: Negative for cough and shortness of breath.   Cardiovascular: Negative for chest pain, palpitations and leg swelling.  Gastrointestinal: Negative for abdominal pain, constipation and diarrhea.  Genitourinary: Negative for difficulty urinating, frequency and menstrual problem.  Musculoskeletal: Negative for arthralgias and back pain.  Neurological: Negative for dizziness and headaches.  Psychiatric/Behavioral: Positive for dysphoric mood. Negative for sleep disturbance. The patient is nervous/anxious.     BP 110/68   Pulse 63   Temp 98.7 F (37.1 C)   Resp 12   Ht 5\' 7"  (1.702 m)   Wt 227 lb 1.9 oz (103 kg)   LMP 11/08/2017 Comment: spotted some on 12/06/17  SpO2 93%   BMI 35.57 kg/m   Physical Exam  Constitutional: She is oriented to person, place, and time. She appears well-developed and well-nourished.  Moderately overweight  HENT:  Head: Normocephalic and atraumatic.  Mouth/Throat: Oropharynx is clear  and moist.  Eyes: Pupils are equal, round, and reactive to light. Conjunctivae are normal.  Neck: Normal range of motion. Neck supple. No thyromegaly present.  Cardiovascular: Normal rate, regular rhythm and normal heart sounds.  Pulmonary/Chest: Effort normal and breath sounds normal. No respiratory distress.  Musculoskeletal: Normal range of motion. She exhibits no edema.  Neurological: She is alert and oriented to person, place, and time.  Gait normal  Skin: Skin is warm and dry.  Psychiatric: Her behavior is  normal. Thought content normal.  Labile, tearful  Nursing note and vitals reviewed.   ASSESSMENT/PLAN:  1. Anxiety, generalized  - Ambulatory referral to Psychiatry   Patient Instructions  Take the buspar as directed I have referred to Psychology  Exercise every day that you are able  Follow up with Dr Tracie HarrierHagler   Eustace MooreYvonne Sue Makya Yurko, MD

## 2017-12-09 NOTE — Patient Instructions (Addendum)
Take the buspar as directed I have referred to Psychology  Exercise every day that you are able  Follow up with Dr Tracie HarrierHagler

## 2017-12-10 MED ORDER — PANTOPRAZOLE SODIUM 40 MG PO TBEC: 40.0000 mg | DELAYED_RELEASE_TABLET | Freq: Two times a day (BID) | ORAL | 3 refills | Status: DC

## 2017-12-10 NOTE — Telephone Encounter (Signed)
We can try Protonix BID for now. I am sending to pharmacy. Can we find out if a PA was needed or possibly just needs a copay card?

## 2017-12-10 NOTE — Telephone Encounter (Signed)
Spoke with pt and she's going to activate her Dexilant copay card. If med is still too expensive, she will pickup Protonix.

## 2017-12-10 NOTE — Telephone Encounter (Signed)
Lmom, waiting on a return call.  

## 2017-12-15 NOTE — Telephone Encounter (Signed)
Rachel Roy, checking to see if pt was able to get her medication.

## 2017-12-16 ENCOUNTER — Ambulatory Visit (HOSPITAL_COMMUNITY): Payer: 59 | Admitting: Psychiatry

## 2017-12-16 ENCOUNTER — Encounter (HOSPITAL_COMMUNITY): Payer: Self-pay | Admitting: Psychiatry

## 2017-12-16 VITALS — BP 108/72 | HR 66 | Ht 67.0 in | Wt 228.0 lb

## 2017-12-16 DIAGNOSIS — R45851 Suicidal ideations: Secondary | ICD-10-CM | POA: Diagnosis not present

## 2017-12-16 DIAGNOSIS — Z818 Family history of other mental and behavioral disorders: Secondary | ICD-10-CM

## 2017-12-16 DIAGNOSIS — F411 Generalized anxiety disorder: Secondary | ICD-10-CM | POA: Diagnosis not present

## 2017-12-16 DIAGNOSIS — R450 Nervousness: Secondary | ICD-10-CM | POA: Diagnosis not present

## 2017-12-16 DIAGNOSIS — F410 Panic disorder [episodic paroxysmal anxiety] without agoraphobia: Secondary | ICD-10-CM | POA: Diagnosis not present

## 2017-12-16 DIAGNOSIS — F4310 Post-traumatic stress disorder, unspecified: Secondary | ICD-10-CM | POA: Diagnosis not present

## 2017-12-16 DIAGNOSIS — F331 Major depressive disorder, recurrent, moderate: Secondary | ICD-10-CM

## 2017-12-16 DIAGNOSIS — G4700 Insomnia, unspecified: Secondary | ICD-10-CM

## 2017-12-16 MED ORDER — DULOXETINE HCL 20 MG PO CPEP: 20.0000 mg | ORAL_CAPSULE | Freq: Every day | ORAL | 0 refills | Status: DC

## 2017-12-16 NOTE — Patient Instructions (Addendum)
1. Start Duloxetine 20 mg daily  2. Hold buspar  3. Return to clinic in one month for 30 mins  CONTACT INFORMATION  What to do if you need to get in touch with someone regarding a psychiatric issue:  1. EMERGENCY: For psychiatric emergencies (if you are suicidal or if there are any other safety issues) call 911 and/or go to your nearest Emergency Room immediately.   2. IF YOU NEED SOMEONE TO TALK TO RIGHT NOW: Given my clinical responsibilities, I may not be able to speak with you over the phone for a prolonged period of time.  a. You may always call The National Suicide Prevention Lifeline at 1-800-273-TALK 972-462-1475(8255).  b. Your county of residence will also have local crisis services. [place] [facility] [place]: [facility] at [phone]
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
## 2018-01-04 NOTE — Patient Instructions (Signed)
Rachel Roy  01/04/2018     @PREFPERIOPPHARMACY @   Your procedure is scheduled on 01/13/2018.  Report to [facility] at 7:15 A.M.  Call this number if you have problems the morning of surgery:  [phone]   Remember:  Do not eat food or drink liquids after midnight.  Take these medicines the morning of surgery with A SIP OF WATER Buspar, Dexilant, Cymbalta, Prilosec, Protonix   Do not wear jewelry, make-up or nail polish.  Do not wear lotions, powders, or perfumes, or deodorant.  Do not shave 48 hours prior to surgery.  Men may shave face and neck.  Do not bring valuables to the hospital.  [facility] is not responsible for any belongings or valuables.  Contacts, dentures or bridgework may not be worn into surgery.  Leave your suitcase in the car.  After surgery it may be brought to your room.  For patients admitted to the hospital, discharge time will be determined by your treatment team.  Patients discharged the day of surgery will not be allowed to drive home.    Please read over the following fact sheets that you were given. Anesthesia Post-op Instructions     PATIENT INSTRUCTIONS POST-ANESTHESIA  IMMEDIATELY FOLLOWING SURGERY:  Do not drive or operate machinery for the first twenty four hours after surgery.  Do not make any important decisions for twenty four hours after surgery or while taking narcotic pain medications or sedatives.  If you develop intractable nausea and vomiting or a severe headache please notify your doctor immediately.  FOLLOW-UP:  Please make an appointment with your surgeon as instructed. You do not need to follow up with anesthesia unless specifically instructed to do so.  WOUND CARE INSTRUCTIONS (if applicable):  Keep a dry clean dressing on the anesthesia/puncture wound site if there is drainage.  Once the wound has quit draining you may leave it open to air.  Generally you should leave the bandage intact for twenty four hours unless there  is drainage.  If the epidural site drains for more than 36-48 hours please call the anesthesia department.  QUESTIONS?:  Please feel free to call your physician or the hospital operator if you have any questions, and they will be happy to assist you.      Esophagogastroduodenoscopy Esophagogastroduodenoscopy (EGD) is a procedure to examine the lining of the esophagus, stomach, and first part of the small intestine (duodenum). This procedure is done to check for problems such as inflammation, bleeding, ulcers, or growths. During this procedure, a long, flexible, lighted tube with a camera attached (endoscope) is inserted down the throat. Tell a health care provider about:  Any allergies you have.  All medicines you are taking, including vitamins, herbs, eye drops, creams, and over-the-counter medicines.  Any problems you or family members have had with anesthetic medicines.  Any blood disorders you have.  Any surgeries you have had.  Any medical conditions you have.  Whether you are pregnant or may be pregnant. What are the risks? Generally, this is a safe procedure. However, problems may occur, including:  Infection.  Bleeding.  A tear (perforation) in the esophagus, stomach, or duodenum.  Trouble breathing.  Excessive sweating.  Spasms of the larynx.  A slowed heartbeat.  Low blood pressure.  What happens before the procedure?  Follow instructions from your health care provider about eating or drinking restrictions.  Ask your health care provider about: ? Changing or stopping your regular medicines. This is especially important if you  are taking diabetes medicines or blood thinners. ? Taking medicines such as aspirin and ibuprofen. These medicines can thin your blood. Do not take these medicines before your procedure if your health care provider instructs you not to.  Plan to have someone take you home after the procedure.  If you wear dentures, be ready to remove  them before the procedure. What happens during the procedure?  To reduce your risk of infection, your health care team will wash or sanitize their hands.  An IV tube will be put in a vein in your hand or arm. You will get medicines and fluids through this tube.  You will be given one or more of the following: ? A medicine to help you relax (sedative). ? A medicine to numb the area (local anesthetic). This medicine may be sprayed into your throat. It will make you feel more comfortable and keep you from gagging or coughing during the procedure. ? A medicine for pain.  A mouth guard may be placed in your mouth to protect your teeth and to keep you from biting on the endoscope.  You will be asked to lie on your left side.  The endoscope will be lowered down your throat into your esophagus, stomach, and duodenum.  Air will be put into the endoscope. This will help your health care provider see better.  The lining of your esophagus, stomach, and duodenum will be examined.  Your health care provider may: ? Take a tissue sample so it can be looked at in a lab (biopsy). ? Remove growths. ? Remove objects (foreign bodies) that are stuck. ? Treat any bleeding with medicines or other devices that stop tissue from bleeding. ? Widen (dilate) or stretch narrowed areas of your esophagus and stomach.  The endoscope will be taken out. The procedure may vary among health care providers and hospitals. What happens after the procedure?  Your blood pressure, heart rate, breathing rate, and blood oxygen level will be monitored often until the medicines you were given have worn off.  Do not eat or drink anything until the numbing medicine has worn off and your gag reflex has returned. This information is not intended to replace advice given to you by your health care provider. Make sure you discuss any questions you have with your health care provider. Document Released: 01/01/2005 Document Revised:  02/06/2016 Document Reviewed: 07/25/2015 Elsevier Interactive Patient Education  2018 [employer] Esophageal Dilatation Esophageal dilatation is a procedure to open a blocked or narrowed part of the esophagus. The esophagus is the long tube in your throat that carries food and liquid from your mouth to your stomach. The procedure is also called esophageal dilation. You may need this procedure if you have a buildup of scar tissue in your esophagus that makes it difficult, painful, or even impossible to swallow. This can be caused by gastroesophageal reflux disease (GERD). In rare cases, people need this procedure because they have cancer of the esophagus or a problem with the way food moves through the esophagus. Sometimes you may need to have another dilatation to enlarge the opening of the esophagus gradually. Tell a health care provider about:  Any allergies you have.  All medicines you are taking, including vitamins, herbs, eye drops, creams, and over-the-counter medicines.  Any problems you or family members have had with anesthetic medicines.  Any blood disorders you have.  Any surgeries you have had.  Any medical conditions you have.  Any antibiotic medicines you are required  to take before dental procedures. What are the risks? Generally, this is a safe procedure. However, problems can occur and include:  Bleeding from a tear in the lining of the esophagus.  A hole (perforation) in the esophagus.  What happens before the procedure?  Do not eat or drink anything after midnight on the night before the procedure or as directed by your health care provider.  Ask your health care provider about changing or stopping your regular medicines. This is especially important if you are taking diabetes medicines or blood thinners.  Plan to have someone take you home after the procedure. What happens during the procedure?  You will be given a medicine that makes you relaxed and sleepy  (sedative).  A medicine may be sprayed or gargled to numb the back of the throat.  Your health care provider can use various instruments to do an esophageal dilatation. During the procedure, the instrument used will be placed in your mouth and passed down into your esophagus. Options include: ? Simple dilators. This instrument is carefully placed in the esophagus to stretch it. ? Guided wire bougies. In this method, a flexible tube (endoscope) is used to insert a wire into the esophagus. The dilator is passed over this wire to enlarge the esophagus. Then the wire is removed. ? Balloon dilators. An endoscope with a small balloon at the end is passed down into the esophagus. Inflating the balloon gently stretches the esophagus and opens it up. What happens after the procedure?  Your blood pressure, heart rate, breathing rate, and blood oxygen level will be monitored often until the medicines you were given have worn off.  Your throat may feel slightly sore and will probably still feel numb. This will improve slowly over time.  You will not be allowed to eat or drink until the throat numbness has resolved.  If this is a same-day procedure, you may be allowed to go home once you have been able to drink, urinate, and sit on the edge of the bed without nausea or dizziness.  If this is a same-day procedure, you should have a friend or family member with you for the next 24 hours after the procedure. This information is not intended to replace advice given to you by your health care provider. Make sure you discuss any questions you have with your health care provider. Document Released: 10/22/2005 Document Revised: 02/06/2016 Document Reviewed: 01/10/2014 Elsevier Interactive Patient Education  [employer].

## 2018-01-06 ENCOUNTER — Inpatient Hospital Stay (HOSPITAL_COMMUNITY)
Admission: RE | Admit: 2018-01-06 | Discharge: 2018-01-06 | Disposition: A | Discharge status: Home / Self Care | Payer: 59 | Source: Ambulatory Visit

## 2018-01-06 ENCOUNTER — Telehealth: Payer: Self-pay | Admitting: *Deleted

## 2018-01-06 ENCOUNTER — Encounter (HOSPITAL_COMMUNITY): Payer: Self-pay

## 2018-01-06 NOTE — Telephone Encounter (Signed)
Rachel JonesCarolyn called stating patient called pre-op appt and said she would need to r/s her procedure for 01/13/18. We have not heard from patient.  LMOVM

## 2018-01-07 NOTE — Telephone Encounter (Signed)
LMOVM

## 2018-01-07 NOTE — Telephone Encounter (Signed)
Patent called back and confirmed she wanted to cancel her procedure. LMOVM for Eber JonesCarolyn making aware. FYI to [employer]

## 2018-01-12 ENCOUNTER — Ambulatory Visit (INDEPENDENT_AMBULATORY_CARE_PROVIDER_SITE_OTHER): Payer: 59 | Admitting: Licensed Clinical Social Worker

## 2018-01-12 DIAGNOSIS — F331 Major depressive disorder, recurrent, moderate: Secondary | ICD-10-CM | POA: Diagnosis not present

## 2018-01-12 DIAGNOSIS — Z91410 Personal history of adult physical and sexual abuse: Secondary | ICD-10-CM

## 2018-01-12 DIAGNOSIS — F4310 Post-traumatic stress disorder, unspecified: Secondary | ICD-10-CM

## 2018-01-13 ENCOUNTER — Ambulatory Visit (HOSPITAL_COMMUNITY): Admission: RE | Admit: 2018-01-13 | Payer: 59 | Source: Ambulatory Visit | Admitting: Internal Medicine

## 2018-01-13 ENCOUNTER — Encounter (HOSPITAL_COMMUNITY): Payer: Self-pay | Admitting: Licensed Clinical Social Worker

## 2018-01-13 ENCOUNTER — Encounter (HOSPITAL_COMMUNITY): Admission: RE | Payer: Self-pay | Source: Ambulatory Visit

## 2018-01-13 SURGERY — ESOPHAGOGASTRODUODENOSCOPY (EGD) WITH PROPOFOL
Anesthesia: Monitor Anesthesia Care
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
## 2018-01-25 ENCOUNTER — Ambulatory Visit (INDEPENDENT_AMBULATORY_CARE_PROVIDER_SITE_OTHER): Payer: 59 | Admitting: Psychiatry

## 2018-01-25 ENCOUNTER — Encounter (HOSPITAL_COMMUNITY): Payer: Self-pay | Admitting: Psychiatry

## 2018-01-25 VITALS — BP 117/78 | HR 67 | Ht 67.0 in | Wt 229.0 lb

## 2018-01-25 DIAGNOSIS — F331 Major depressive disorder, recurrent, moderate: Secondary | ICD-10-CM

## 2018-01-25 DIAGNOSIS — F4310 Post-traumatic stress disorder, unspecified: Secondary | ICD-10-CM

## 2018-01-25 DIAGNOSIS — R450 Nervousness: Secondary | ICD-10-CM

## 2018-01-25 DIAGNOSIS — F411 Generalized anxiety disorder: Secondary | ICD-10-CM | POA: Diagnosis not present

## 2018-01-25 DIAGNOSIS — Z818 Family history of other mental and behavioral disorders: Secondary | ICD-10-CM

## 2018-01-25 LAB — TSH: TSH: 1.81 m[IU]/L

## 2018-01-25 MED ORDER — DULOXETINE HCL 20 MG PO CPEP
20.0000 mg | ORAL_CAPSULE | Freq: Every day | ORAL | 1 refills | Status: DC
Start: 2018-01-25 — End: 2018-06-03

## 2018-01-25 NOTE — Patient Instructions (Signed)
1. Contiue Duloxetine 20 mg daily  2. Return to clinic in two months for 30 mins 3. Obtain blood test

## 2018-01-31 ENCOUNTER — Ambulatory Visit (HOSPITAL_COMMUNITY): Payer: 59 | Admitting: Psychiatry

## 2018-02-18 ENCOUNTER — Encounter: Payer: Self-pay | Admitting: Family Medicine

## 2018-02-22 ENCOUNTER — Ambulatory Visit (INDEPENDENT_AMBULATORY_CARE_PROVIDER_SITE_OTHER): Payer: 59 | Admitting: Licensed Clinical Social Worker

## 2018-02-22 DIAGNOSIS — F4310 Post-traumatic stress disorder, unspecified: Secondary | ICD-10-CM

## 2018-02-22 NOTE — Progress Notes (Signed)
   THERAPIST PROGRESS NOTE  Session Time: 3:00 pm-3:45 pm  Participation Level: Active  Behavioral Response: NeatAlertEuthymic  Type of Therapy: Individual Therapy  Treatment Goals addressed: Anxiety  Interventions: CBT and Solution Focused  Summary: Rachel SauersKatherine Omahoney is a 32 y.o. female who presents oriented x5 (person, place,situation, time, and object), alert, neatly dressed, appropriately groomed, average height, overweight and cooperative to address anxiety. Patient has a minimal history of medical treatment including GERD and heart murmur. Patient has a minimal history of mental health treatment including medication management. Patient denies symptoms of mania. She denies suicidal and homicidal ideations. Patient denies psychosis including auditory and visual hallucinations. Patient denies substance abuse. She is at low risk for lethality.   Physically: Patient is sleeping better. She has reduced her coffee intake which has helped with her acid reflux. Patient has reduced her caffeine intake as well.  Spiritually/Values: Patient is doing well spiritually. She feels like she needs to be more consistent.  Relationships: Patient feels like she is less irritable with her husband. She is trying to let go of some of the control she has in the relationship and let him take on some responsibilities.  Emotional/Mental/Behavior: Patient is in a better mood. She admitted that she needs to be more consistent with taking her medication. She is going to try to take it first thing in the morning so that she doesn't forget. Patient also noted that she is using thought stopping techniques to stay more present. When she finds herself thinking about the past or future too much, she will remind herself to stop and stay in the present.   Suicidal/Homicidal: Negativewithout intent/plan  Therapist Response: Therapist reviewed patient's recent thoughts and behaviors. Therapist utilized CBT to address anxiety.  Therapist processed patient's feelings to identify triggers for anxiety. Therapist assisted patient in identifying steps to being more consistent with taking medication and being consistent in her behaviors to reduce anxiety.   Plan: Return again in 2-3 weeks.  Diagnosis: Axis I: Post Traumatic Stress Disorder    Axis II: No diagnosis    Bynum BellowsJoshua Siddalee Vanderheiden, LCSW 02/22/2018

## 2018-03-08 ENCOUNTER — Ambulatory Visit (HOSPITAL_COMMUNITY): Payer: 59 | Admitting: Licensed Clinical Social Worker

## 2018-03-09 ENCOUNTER — Ambulatory Visit: Payer: 59 | Admitting: Gastroenterology
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
## 2018-03-22 ENCOUNTER — Ambulatory Visit (HOSPITAL_COMMUNITY): Payer: 59 | Admitting: Licensed Clinical Social Worker

## 2018-03-28 ENCOUNTER — Ambulatory Visit (HOSPITAL_COMMUNITY): Payer: 59 | Admitting: Psychiatry

## 2018-04-19 ENCOUNTER — Ambulatory Visit (HOSPITAL_COMMUNITY): Payer: 59 | Admitting: Licensed Clinical Social Worker

## 2018-05-03 ENCOUNTER — Ambulatory Visit (HOSPITAL_COMMUNITY): Payer: 59 | Admitting: Licensed Clinical Social Worker

## 2018-05-19 ENCOUNTER — Encounter (HOSPITAL_COMMUNITY): Payer: Self-pay | Admitting: Licensed Clinical Social Worker

## 2018-05-19 ENCOUNTER — Ambulatory Visit (INDEPENDENT_AMBULATORY_CARE_PROVIDER_SITE_OTHER): Payer: 59 | Admitting: Licensed Clinical Social Worker

## 2018-05-19 DIAGNOSIS — F4310 Post-traumatic stress disorder, unspecified: Secondary | ICD-10-CM | POA: Diagnosis not present

## 2018-05-19 NOTE — Progress Notes (Signed)
   THERAPIST PROGRESS NOTE  Session Time: 4:00 pm-4:45 pm  Participation Level: Active  Behavioral Response: NeatAlertEuthymic  Type of Therapy: Individual Therapy  Treatment Goals addressed: Anxiety  Interventions: CBT and Solution Focused  Summary: Rachel Roy is a 32 y.o. female who presents oriented x5 (person, place,situation, time, and object), alert, neatly dressed, appropriately groomed, average height, overweight and cooperative to address anxiety. Patient has a minimal history of medical treatment including GERD and heart murmur. Patient has a minimal history of mental health treatment including medication management. Patient denies symptoms of mania. She denies suicidal and homicidal ideations. Patient denies psychosis including auditory and visual hallucinations. Patient denies substance abuse. She is at low risk for lethality.   Physically: Patient has been doing well physically.  Spiritually/Values: Patient is doing well spiritually. She feels like she needs to be more consistent.  Relationships: Patient has been getting along with her husband.  Emotional/Mental/Behavior: Patient has been struggling with some anxious thoughts. She recognizes that she has irrational thoughts when driving, when interacting with new people and dealing with heights. Patient got a new position at work where she does not feel confident in herself. After discussion, patient understood that this was anxiety and she doesn't have to believe everything that she thinks. She agreed to use deep breathing and self talk to help her build up distress tolerance to situations that trigger anxiety. Patient also agreed to take her medicine regularly.   Suicidal/Homicidal: Negativewithout intent/plan  Therapist Response: Therapist reviewed patient's recent thoughts and behaviors. Therapist utilized CBT to address anxiety. Therapist processed patient's feelings to identify triggers for anxiety. Therapist assisted  patient with ways to build up a tolerance to anxiety and manage it.   Plan: Return again in 2-3 weeks.  Diagnosis: Axis I: Post Traumatic Stress Disorder    Axis II: No diagnosis    Bynum Bellows, LCSW 05/19/2018
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
## 2018-06-03 ENCOUNTER — Encounter (HOSPITAL_COMMUNITY): Payer: Self-pay | Admitting: Psychiatry

## 2018-06-03 ENCOUNTER — Ambulatory Visit (INDEPENDENT_AMBULATORY_CARE_PROVIDER_SITE_OTHER): Payer: 59 | Admitting: Psychiatry

## 2018-06-03 ENCOUNTER — Ambulatory Visit: Payer: 59 | Admitting: Gastroenterology

## 2018-06-03 VITALS — BP 119/78 | HR 94 | Ht 67.0 in | Wt 225.0 lb

## 2018-06-03 DIAGNOSIS — F411 Generalized anxiety disorder: Secondary | ICD-10-CM

## 2018-06-03 MED ORDER — DULOXETINE HCL 20 MG PO CPEP: 20.0000 mg | ORAL_CAPSULE | Freq: Every day | ORAL | 0 refills | Status: DC

## 2018-06-03 NOTE — Patient Instructions (Signed)
1. Continue Duloxetine 20 mg daily  2. Return to clinic in month for 15 mins

## 2018-06-15 ENCOUNTER — Telehealth (HOSPITAL_COMMUNITY): Payer: Self-pay | Admitting: *Deleted

## 2018-06-15 NOTE — Telephone Encounter (Signed)
FOLLOWED WITH Rx TO ENSURE PATIENT'S SCRIPT FOR CYMBALTA
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
## 2018-07-05 ENCOUNTER — Ambulatory Visit (INDEPENDENT_AMBULATORY_CARE_PROVIDER_SITE_OTHER): Payer: 59 | Admitting: Psychiatry

## 2018-07-05 ENCOUNTER — Encounter (HOSPITAL_COMMUNITY): Payer: Self-pay | Admitting: Psychiatry

## 2018-07-05 VITALS — BP 116/84 | HR 78 | Ht 67.0 in | Wt 223.0 lb

## 2018-07-05 DIAGNOSIS — F411 Generalized anxiety disorder: Secondary | ICD-10-CM | POA: Diagnosis not present

## 2018-07-05 DIAGNOSIS — F4310 Post-traumatic stress disorder, unspecified: Secondary | ICD-10-CM | POA: Diagnosis not present

## 2018-07-05 DIAGNOSIS — F332 Major depressive disorder, recurrent severe without psychotic features: Secondary | ICD-10-CM

## 2018-07-05 MED ORDER — DULOXETINE HCL 40 MG PO CPEP: 40.0000 mg | ORAL_CAPSULE | Freq: Every day | ORAL | 0 refills | Status: DC

## 2018-07-05 NOTE — Patient Instructions (Signed)
1. Increase duloxetine 40 mg daily  2. Return to clinic in month for 15 mins

## 2018-07-11 ENCOUNTER — Ambulatory Visit (HOSPITAL_COMMUNITY): Payer: Self-pay | Admitting: Licensed Clinical Social Worker
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
## 2018-08-02 ENCOUNTER — Encounter (HOSPITAL_COMMUNITY): Payer: Self-pay | Admitting: Psychiatry

## 2018-08-02 ENCOUNTER — Ambulatory Visit (INDEPENDENT_AMBULATORY_CARE_PROVIDER_SITE_OTHER): Payer: PRIVATE HEALTH INSURANCE | Admitting: Psychiatry

## 2018-08-02 VITALS — BP 120/83 | HR 79 | Ht 67.0 in | Wt 225.0 lb

## 2018-08-02 DIAGNOSIS — F411 Generalized anxiety disorder: Secondary | ICD-10-CM | POA: Diagnosis not present

## 2018-08-02 MED ORDER — DULOXETINE HCL 40 MG PO CPEP: 40.0000 mg | ORAL_CAPSULE | Freq: Every day | ORAL | 0 refills | Status: DC

## 2018-08-02 MED ORDER — DULOXETINE HCL 40 MG PO CPEP: 40.0000 mg | ORAL_CAPSULE | Freq: Every day | ORAL | 2 refills | Status: DC

## 2018-08-02 NOTE — Patient Instructions (Signed)
1. Continue  duloxetine 40 mg daily  2. Return to clinicin three months for 15 mins

## 2018-08-03 ENCOUNTER — Ambulatory Visit (HOSPITAL_COMMUNITY): Payer: Self-pay | Admitting: Licensed Clinical Social Worker

## 2018-08-25 ENCOUNTER — Ambulatory Visit (HOSPITAL_COMMUNITY): Payer: Self-pay | Admitting: Licensed Clinical Social Worker

## 2018-09-15 ENCOUNTER — Encounter: Payer: Self-pay | Admitting: Gastroenterology

## 2018-09-15 ENCOUNTER — Ambulatory Visit: Payer: 59 | Admitting: Gastroenterology

## 2018-09-15 ENCOUNTER — Telehealth: Payer: Self-pay | Admitting: Gastroenterology

## 2018-09-15 NOTE — Telephone Encounter (Signed)
PATIENT WAS A NO SHOW AND LETTER SENT  °

## 2018-09-21 ENCOUNTER — Ambulatory Visit (HOSPITAL_COMMUNITY): Payer: Self-pay | Admitting: Licensed Clinical Social Worker

## 2018-10-10 ENCOUNTER — Other Ambulatory Visit: Payer: Self-pay | Admitting: Adult Health

## 2018-10-18 ENCOUNTER — Ambulatory Visit (INDEPENDENT_AMBULATORY_CARE_PROVIDER_SITE_OTHER): Payer: 59 | Admitting: Adult Health

## 2018-10-18 ENCOUNTER — Encounter: Payer: Self-pay | Admitting: Adult Health

## 2018-10-18 VITALS — BP 125/83 | HR 88 | Ht 66.5 in | Wt 229.0 lb

## 2018-10-18 DIAGNOSIS — Z319 Encounter for procreative management, unspecified: Secondary | ICD-10-CM | POA: Diagnosis not present

## 2018-10-18 NOTE — Progress Notes (Signed)
Patient ID: Rachel Roy, female   DOB: [birth year], 33 y.o.   MRN: [id number] History of Present Illness: Rachel Roy is a 33 year old white female, married, G0P0, new to this practice in to discuss getting pregnant. She works for [employer], has large region, and some stress.   Had normal pap in 2019 with Dr Delton See, no current PCP.She sees Dr Vanetta Shawl, and is on Cymbalta for anxiety.    Current Medications, Allergies, Past Medical History, Past Surgical History, Family History and Social History were reviewed in [facility] record.     Review of Systems: Periods are regular, about every 28-34 days occasionally 40 They last about 5 days Has been trying to get pregnant for about a year   Physical Exam:BP 125/83 (BP Location: Left Arm, Patient Position: Sitting, Cuff Size: Large)   Pulse 88   Ht 5' 6.5" (1.689 m)   Wt 229 lb (103.9 kg)   LMP 10/12/2018   BMI 36.41 kg/m  General:  Well developed, well nourished, no acute distress Skin:  Warm and dry Lungs; Clear to auscultation bilaterally Cardiovascular: Regular rate and rhythm Psych:  No mood changes, alert and cooperative,seems happy Fall risk is low. Discussed timing of sex and ovulation.Have sex every other day 7-24 of cycle and pee before sex and lay there 30 minutes after. Will check progesterone level 11/01/2018 Take PNV with folic acid, and husband take vitamin C, wear boxers and avoid hot tub. May consider clomid if not ovulating and semen analysis.  Start eating less processed foods, and more fruit and veggies, even 20 lbs weight loss may help and may get [place] to assess ovaries. Had normal labs 2019.  Face time 30 minutes with 50 % counseling and talking.    Impression: 1. Patient desires pregnancy       Plan: Check progesterone level 11/01/2018 Will talk when results back Review handout on preparing for pregnancy

## 2018-10-18 NOTE — Patient Instructions (Signed)
Preparing for Pregnancy  If you are considering becoming pregnant, make an appointment to see your regular health care provider to learn how to prepare for a safe and healthy pregnancy (preconception care). During a preconception care visit, your health care provider will:  · Do a complete physical exam, including a Pap test.  · Take a complete medical history.  · Give you information, answer your questions, and help you resolve problems.  Preconception checklist  Medical history  · Tell your health care provider about any current or past medical conditions. Your pregnancy or your ability to become pregnant may be affected by chronic conditions, such as diabetes, chronic hypertension, and thyroid problems.  · Include your family's medical history as well as your partner's medical history.  · Tell your health care provider about any history of STIs (sexually transmitted infections). These can affect your pregnancy. In some cases, they can be passed to your baby. Discuss any concerns that you have about STIs.  · If indicated, discuss the benefits of genetic testing. This testing will show whether there are any genetic conditions that may be passed from you or your partner to your baby.  · Tell your health care provider about:  ? Any problems you have had with conception or pregnancy.  ? Any medicines you take. These include vitamins, herbal supplements, and over-the-counter medicines.  ? Your history of immunizations. Discuss any vaccinations that you may need.  Diet  · Ask your health care provider what to include in a healthy diet that has a balance of nutrients. This is especially important when you are pregnant or preparing to become pregnant.  · Ask your health care provider to help you reach a healthy weight before pregnancy.  ? If you are overweight, you may be at higher risk for certain complications, such as high blood pressure, diabetes, and preterm birth.  ? If you are underweight, you are more likely to  have a baby who has a low birth weight.  Lifestyle, work, and home  · Let your health care provider know:  ? About any lifestyle habits that you have, such as alcohol use, drug use, or smoking.  ? About recreational activities that may put you at risk during pregnancy, such as downhill skiing and certain exercise programs.  ? Tell your health care provider about any international travel, especially any travel to places with an active Zika virus outbreak.  ? About harmful substances that you may be exposed to at work or at home. These include chemicals, pesticides, radiation, or even litter boxes.  ? If you do not feel safe at home.  Mental health  · Tell your health care provider about:  ? Any history of mental health conditions, including feelings of depression, sadness, or anxiety.  ? Any medicines that you take for a mental health condition. These include herbs and supplements.  Home instructions to prepare for pregnancy  Lifestyle    · Eat a balanced diet. This includes fresh fruits and vegetables, whole grains, lean meats, low-fat dairy products, healthy fats, and foods that are high in fiber. Ask to meet with a nutritionist or registered dietitian for assistance with meal planning and goals.  · Get regular exercise. Try to be active for at least 30 minutes a day on most days of the week. Ask your health care provider which activities are safe during pregnancy.  · Do not use any products that contain nicotine or tobacco, such as cigarettes and e-cigarettes. If you need   help quitting, ask your health care provider.  · Do not drink alcohol.  · Do not take illegal drugs.  · Maintain a healthy weight. Ask your health care provider what weight range is right for you.  General instructions  · Keep an accurate record of your menstrual periods. This makes it easier for your health care provider to determine your baby's due date.  · Begin taking prenatal vitamins and folic acid supplements daily as directed by your  health care provider.  · Manage any chronic conditions, such as high blood pressure and diabetes, as told by your health care provider. This is important.  How do I know that I am pregnant?  You may be pregnant if you have been sexually active and you miss your period. Symptoms of early pregnancy include:  · Mild cramping.  · Very light vaginal bleeding (spotting).  · Feeling unusually tired.  · Nausea and vomiting (morning sickness).  If you have any of these symptoms and you suspect that you might be pregnant, you can take a home pregnancy test. These tests check for a hormone in your urine (human chorionic gonadotropin, or hCG). A woman's body begins to make this hormone during early pregnancy. These tests are very accurate. Wait until at least the first day after you miss your period to take one. If the test shows that you are pregnant (you get a positive result), call your health care provider to make an appointment for prenatal care.  What should I do if I become pregnant?         · Make an appointment with your health care provider as soon as you suspect you are pregnant.  · Do not use any products that contain nicotine, such as cigarettes, chewing tobacco, and e-cigarettes. If you need help quitting, ask your health care provider.  · Do not drink alcoholic beverages. Alcohol is related to a number of birth defects.  · Avoid toxic odors and chemicals.  · You may continue to have sexual intercourse if it does not cause pain or other problems, such as vaginal bleeding.  This information is not intended to replace advice given to you by your health care provider. Make sure you discuss any questions you have with your health care provider.  Document Released: 08/13/2008 Document Revised: 09/02/2017 Document Reviewed: 03/22/2016  Elsevier Interactive Patient Education © 2019 [employer]
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
## 2018-11-02 ENCOUNTER — Ambulatory Visit (INDEPENDENT_AMBULATORY_CARE_PROVIDER_SITE_OTHER): Payer: PRIVATE HEALTH INSURANCE | Admitting: Psychiatry

## 2018-11-02 ENCOUNTER — Encounter (HOSPITAL_COMMUNITY): Payer: Self-pay | Admitting: Psychiatry

## 2018-11-02 VITALS — BP 134/86 | HR 76 | Ht 66.5 in | Wt 227.0 lb

## 2018-11-02 DIAGNOSIS — F411 Generalized anxiety disorder: Secondary | ICD-10-CM | POA: Diagnosis not present

## 2018-11-02 LAB — PROGESTERONE: PROGESTERONE: 0.2 ng/mL

## 2018-11-02 MED ORDER — DULOXETINE HCL 40 MG PO CPEP: 40.0000 mg | ORAL_CAPSULE | Freq: Every day | ORAL | 1 refills | Status: DC

## 2018-11-02 NOTE — Patient Instructions (Signed)
1. Continue  duloxetine 40 mg daily  2.Return to clinicin four months for 15 mins

## 2018-11-03 ENCOUNTER — Telehealth: Payer: Self-pay | Admitting: Adult Health

## 2018-11-03 MED ORDER — CLOMIPHENE CITRATE 50 MG PO TABS: 50.0000 mg | ORAL_TABLET | Freq: Every day | ORAL | 2 refills | Status: DC

## 2018-11-03 MED ORDER — CLOMIPHENE CITRATE 50 MG PO TABS: ORAL_TABLET | ORAL | 2 refills | Status: DC

## 2018-11-03 NOTE — Telephone Encounter (Signed)
Pt wants clomid, call with period will check progesterone level day 21 of cycle. Sex  Every other day day 7-24 of cycle.

## 2019-01-25 ENCOUNTER — Telehealth: Payer: Self-pay | Admitting: Adult Health

## 2019-01-25 NOTE — Telephone Encounter (Signed)
Patient called stating that she would like a call back from [place] as soon as possible. Patient did not state the reason for the call. Please contact pt.

## 2019-01-25 NOTE — Telephone Encounter (Signed)
Has not had a period and has had negative HPT, start clomid today, [place] so just skip progesterone level this month and call me next cycle.

## 2019-02-20 ENCOUNTER — Telehealth: Payer: Self-pay | Admitting: Adult Health

## 2019-02-20 DIAGNOSIS — Z319 Encounter for procreative management, unspecified: Secondary | ICD-10-CM

## 2019-02-20 MED ORDER — CLOMIPHENE CITRATE 50 MG PO TABS: ORAL_TABLET | ORAL | 2 refills | Status: DC

## 2019-02-20 NOTE — Telephone Encounter (Signed)
Patient called, stated she was put on Clomid, had a period 02/10/19.  She stated that she's not sure where she needs to go from here, would like to speak to [place].  [facility]  [phone]

## 2019-02-20 NOTE — Addendum Note (Signed)
Addended by: Derrek Monaco A on: 02/20/2019 05:00 PM   Modules accepted: Orders

## 2019-02-20 NOTE — Telephone Encounter (Signed)
LMP 5/29, check progesterone level 6/18, missed clomid this month will increase to clomid 100 mg
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
## 2019-03-03 ENCOUNTER — Other Ambulatory Visit: Payer: Self-pay

## 2019-03-03 ENCOUNTER — Ambulatory Visit (INDEPENDENT_AMBULATORY_CARE_PROVIDER_SITE_OTHER): Payer: PRIVATE HEALTH INSURANCE | Admitting: Psychiatry

## 2019-03-03 ENCOUNTER — Encounter (HOSPITAL_COMMUNITY): Payer: Self-pay | Admitting: Psychiatry

## 2019-03-03 DIAGNOSIS — F411 Generalized anxiety disorder: Secondary | ICD-10-CM | POA: Diagnosis not present

## 2019-03-03 MED ORDER — DULOXETINE HCL 40 MG PO CPEP: 40.0000 mg | ORAL_CAPSULE | Freq: Every day | ORAL | 0 refills | Status: DC

## 2019-03-03 NOTE — Patient Instructions (Signed)
1.Continueduloxetine 40 mg daily  2.Next appointment: 10/12 at 8:40

## 2019-03-22 ENCOUNTER — Ambulatory Visit (INDEPENDENT_AMBULATORY_CARE_PROVIDER_SITE_OTHER): Payer: PRIVATE HEALTH INSURANCE | Admitting: Advanced Practice Midwife

## 2019-03-22 ENCOUNTER — Other Ambulatory Visit: Payer: Self-pay

## 2019-03-22 ENCOUNTER — Encounter: Payer: Self-pay | Admitting: Advanced Practice Midwife

## 2019-03-22 VITALS — Ht 66.2 in | Wt 200.0 lb

## 2019-03-22 DIAGNOSIS — O3680X0 Pregnancy with inconclusive fetal viability, not applicable or unspecified: Secondary | ICD-10-CM | POA: Diagnosis not present

## 2019-03-22 DIAGNOSIS — Z3A01 Less than 8 weeks gestation of pregnancy: Secondary | ICD-10-CM

## 2019-03-22 MED ORDER — DOXYLAMINE-PYRIDOXINE 10-10 MG PO TBEC: DELAYED_RELEASE_TABLET | ORAL | 3 refills | Status: DC

## 2019-03-22 NOTE — Patient Instructions (Signed)

## 2019-03-22 NOTE — Progress Notes (Signed)
   TELEHEALTH VIRTUAL GYNECOLOGY VISIT ENCOUNTER NOTE  I connected with Rachel Roy on 03/22/19 at  9:00 AM EDT by telephone at home and verified that I am speaking with the correct person using two identifiers.   I discussed the limitations, risks, security and privacy concerns of performing an evaluation and management service by telephone and the availability of in person appointments. I also discussed with the patient that there may be a patient responsible charge related to this service. The patient expressed understanding and agreed to proceed.   History:  Rachel Roy is a 33 y.o. G5P0000 female being evaluated today for 3 + HPTs.  She had taken one round of clomid early may, had a period end of may, and didn't take clomid in June.  Feel tired, very nauseated, and + breast tenderness. She denies any abnormal vaginal discharge, bleeding, pelvic pain or other concerns.       Past Medical History:  Diagnosis Date  . Anxiety   . GERD (gastroesophageal reflux disease)   . Heart murmur    Past Surgical History:  Procedure Laterality Date  . BREAST SURGERY  2010   reduction   The following portions of the patient's history were reviewed and updated as appropriate: allergies, current medications, past family history, past medical history, past social history, past surgical history and problem list. Is on Cymbalta, had visit w/MD a few weeks ago, OK to continue. Has anxiety.   Health Maintenance:  Normal pap and negative HRHPV on 10/26/17.    Review of Systems:  Pertinent items noted in HPI and remainder of comprehensive ROS otherwise negative.  Physical Exam:   General:  Alert, oriented and cooperative.   Mental Status: Normal mood and affect perceived. Normal judgment and thought content.  Physical exam deferred due to nature of the encounter  Labs and Imaging No results found for this or any previous visit (from the past 336 hour(s)). No results found.    Assessment and  Plan:     Pregnancy, EDC 11/17/19 (5.5 weeks) by sure LMP  Nausea/vomiting of pregnancy Rx diclegis  I discussed the assessment and treatment plan with the patient. The patient was provided an opportunity to ask questions and all were answered. The patient agreed with the plan and demonstrated an understanding of the instructions.   The patient was advised to call back or seek an in-person evaluation/go to the ED if the symptoms worsen or if the condition fails to improve as anticipated.  I provided 11 minutes of non-face-to-face time during this encounter.   Christin Fudge, [place] for [employer], [place]

## 2019-04-12 ENCOUNTER — Other Ambulatory Visit: Payer: Self-pay | Admitting: Obstetrics & Gynecology

## 2019-04-12 DIAGNOSIS — O3680X0 Pregnancy with inconclusive fetal viability, not applicable or unspecified: Secondary | ICD-10-CM

## 2019-04-14 ENCOUNTER — Other Ambulatory Visit: Payer: Self-pay | Admitting: Obstetrics & Gynecology

## 2019-04-14 ENCOUNTER — Ambulatory Visit (INDEPENDENT_AMBULATORY_CARE_PROVIDER_SITE_OTHER): Payer: PRIVATE HEALTH INSURANCE

## 2019-04-14 ENCOUNTER — Other Ambulatory Visit: Payer: Self-pay

## 2019-04-14 DIAGNOSIS — O3680X0 Pregnancy with inconclusive fetal viability, not applicable or unspecified: Secondary | ICD-10-CM

## 2019-04-14 DIAGNOSIS — Z3A09 9 weeks gestation of pregnancy: Secondary | ICD-10-CM | POA: Diagnosis not present

## 2019-04-14 DIAGNOSIS — O30001 Twin pregnancy, unspecified number of placenta and unspecified number of amniotic sacs, first trimester: Secondary | ICD-10-CM

## 2019-04-14 NOTE — Progress Notes (Signed)
[place] DI/DI twin IUP,normal ovaries bilat BABY A: CRL 27.90 mm,fhr 171 bpm BABY B: CRL 28.34 mm,fhr 174 bpm

## 2019-05-08 ENCOUNTER — Other Ambulatory Visit: Payer: Self-pay | Admitting: Obstetrics and Gynecology

## 2019-05-08 DIAGNOSIS — Z3682 Encounter for antenatal screening for nuchal translucency: Secondary | ICD-10-CM

## 2019-05-08 DIAGNOSIS — O30001 Twin pregnancy, unspecified number of placenta and unspecified number of amniotic sacs, first trimester: Secondary | ICD-10-CM

## 2019-05-09 ENCOUNTER — Ambulatory Visit (INDEPENDENT_AMBULATORY_CARE_PROVIDER_SITE_OTHER): Payer: PRIVATE HEALTH INSURANCE | Admitting: Women's Health

## 2019-05-09 ENCOUNTER — Encounter: Payer: Self-pay | Admitting: Women's Health

## 2019-05-09 ENCOUNTER — Ambulatory Visit: Payer: PRIVATE HEALTH INSURANCE | Admitting: *Deleted

## 2019-05-09 ENCOUNTER — Ambulatory Visit (INDEPENDENT_AMBULATORY_CARE_PROVIDER_SITE_OTHER): Payer: PRIVATE HEALTH INSURANCE

## 2019-05-09 ENCOUNTER — Other Ambulatory Visit: Payer: Self-pay

## 2019-05-09 VITALS — BP 114/79 | HR 76 | Wt 227.5 lb

## 2019-05-09 DIAGNOSIS — O30041 Twin pregnancy, dichorionic/diamniotic, first trimester: Secondary | ICD-10-CM

## 2019-05-09 DIAGNOSIS — Z3682 Encounter for antenatal screening for nuchal translucency: Secondary | ICD-10-CM | POA: Diagnosis not present

## 2019-05-09 DIAGNOSIS — Z1389 Encounter for screening for other disorder: Secondary | ICD-10-CM

## 2019-05-09 DIAGNOSIS — O30049 Twin pregnancy, dichorionic/diamniotic, unspecified trimester: Secondary | ICD-10-CM | POA: Insufficient documentation

## 2019-05-09 DIAGNOSIS — Z363 Encounter for antenatal screening for malformations: Secondary | ICD-10-CM

## 2019-05-09 DIAGNOSIS — O30001 Twin pregnancy, unspecified number of placenta and unspecified number of amniotic sacs, first trimester: Secondary | ICD-10-CM

## 2019-05-09 DIAGNOSIS — O0991 Supervision of high risk pregnancy, unspecified, first trimester: Secondary | ICD-10-CM

## 2019-05-09 DIAGNOSIS — O0990 Supervision of high risk pregnancy, unspecified, unspecified trimester: Secondary | ICD-10-CM | POA: Insufficient documentation

## 2019-05-09 DIAGNOSIS — Z3A12 12 weeks gestation of pregnancy: Secondary | ICD-10-CM

## 2019-05-09 DIAGNOSIS — Z331 Pregnant state, incidental: Secondary | ICD-10-CM

## 2019-05-09 DIAGNOSIS — Z1379 Encounter for other screening for genetic and chromosomal anomalies: Secondary | ICD-10-CM

## 2019-05-09 LAB — POCT URINALYSIS DIPSTICK OB
Blood, UA: NEGATIVE
Glucose, UA: NEGATIVE
Ketones, UA: NEGATIVE
Leukocytes, UA: NEGATIVE
Nitrite, UA: NEGATIVE
POC,PROTEIN,UA: NEGATIVE

## 2019-05-09 NOTE — Patient Instructions (Addendum)
Rachel Roy, I greatly value your feedback.  If you receive a survey following your visit with [place] today, we appreciate you taking the time to fill it out.  Thanks, Joellyn Haff, CNM, [facility]  [facility] HAS MOVED!!! It is now [facility] at  [facility] ([address] [place], [place] [address]) Entrance located off of [address] 24/7 valet parking   Begin taking a 81mg  baby aspirin daily to decrease risk of preeclampsia during pregnancy   Vitamin B6 (pyridoxine) 25mg  four times a day OR 50mg  twice a day   Nausea & Vomiting  Have saltine crackers or pretzels by your bed and eat a few bites before you raise your head out of bed in the morning  Eat small frequent meals throughout the day instead of large meals  Drink plenty of fluids throughout the day to stay hydrated, just don't drink a lot of fluids with your meals.  This can make your stomach fill up faster making you feel sick  Do not brush your teeth right after you eat  Products with real ginger are good for nausea, like ginger ale and ginger hard candy Make sure it says made with real ginger!  Sucking on sour candy like lemon heads is also good for nausea  If your prenatal vitamins make you nauseated, take them at night so you will sleep through the nausea  Sea Bands  If you feel like you need medicine for the nausea & vomiting please let us know  If you are unable to keep any fluids or food down please let us know   Constipation  Drink plenty of fluid, preferably water, throughout the day  Eat foods high in fiber such as fruits, vegetables, and grains  Exercise, such as walking, is a good way to keep your bowels regular  Drink warm fluids, especially warm prune juice, or decaf coffee  Eat a 1/2 cup of real oatmeal (not instant), 1/2 cup applesauce, and 1/2-1 cup warm prune juice every day  If needed, you may take Colace (docusate sodium) stool softener once or twice a day to help  keep the stool soft.   If you still are having problems with constipation, you may take Miralax once daily as needed to help keep your bowels regular.   Home Blood Pressure Monitoring for Patients   Your provider has recommended that you check your blood pressure (BP) at least once a week at home. If you do not have a blood pressure cuff at home, one will be provided for you. Contact your provider if you have not received your monitor within 1 week.   Helpful Tips for Accurate Home Blood Pressure Checks  . Don't smoke, exercise, or drink caffeine 30 minutes before checking your BP . Use the restroom before checking your BP (a full bladder can raise your pressure) . Relax in a comfortable upright chair . Feet on the ground . Left arm resting comfortably on a flat surface at the level of your heart . Legs uncrossed . Back supported . Sit quietly and don't talk . Place the cuff on your bare arm . Adjust snuggly, so that only two fingertips can fit between your skin and the top of the cuff . Check 2 readings separated by at least one minute . Keep a log of your BP readings . For a visual, please reference this diagram: http://ccnc.care/bpdiagram  Provider Name: [facility]     Phone: [phone]  Zone 1:  ALL CLEAR  Continue to monitor your symptoms:  . BP reading is less than 140 (top number) or less than 90 (bottom number)  . No right upper stomach pain . No headaches or seeing spots . No feeling nauseated or throwing up . No swelling in face and hands  Zone 2: CAUTION Call your doctor's office for any of the following:  . BP reading is greater than 140 (top number) or greater than 90 (bottom number)  . Stomach pain under your ribs in the middle or right side . Headaches or seeing spots . Feeling nauseated or throwing up . Swelling in face and hands  Zone 3: EMERGENCY  Seek immediate medical care if you have any of the following:  . BP reading is greater than160 (top  number) or greater than 110 (bottom number) . Severe headaches not improving with Tylenol . Serious difficulty catching your breath . Any worsening symptoms from Zone 2    First Trimester of Pregnancy The first trimester of pregnancy is from week 1 until the end of week 12 (months 1 through 3). A week after a sperm fertilizes an egg, the egg will implant on the wall of the uterus. This embryo will begin to develop into a baby. Genes from you and your partner are forming the baby. The female genes determine whether the baby is a boy or a girl. At 6-8 weeks, the eyes and face are formed, and the heartbeat can be seen on ultrasound. At the end of 12 weeks, all the baby's organs are formed.  Now that you are pregnant, you will want to do everything you can to have a healthy baby. Two of the most important things are to get good prenatal care and to follow your health care provider's instructions. Prenatal care is all the medical care you receive before the baby's birth. This care will help prevent, find, and treat any problems during the pregnancy and childbirth. BODY CHANGES Your body goes through many changes during pregnancy. The changes vary from woman to woman.   You may gain or lose a couple of pounds at first.  You may feel sick to your stomach (nauseous) and throw up (vomit). If the vomiting is uncontrollable, call your health care provider.  You may tire easily.  You may develop headaches that can be relieved by medicines approved by your health care provider.  You may urinate more often. Painful urination may mean you have a bladder infection.  You may develop heartburn as a result of your pregnancy.  You may develop constipation because certain hormones are causing the muscles that push waste through your intestines to slow down.  You may develop hemorrhoids or swollen, bulging veins (varicose veins).  Your breasts may begin to grow larger and become tender. Your nipples may stick out  more, and the tissue that surrounds them (areola) may become darker.  Your gums may bleed and may be sensitive to brushing and flossing.  Dark spots or blotches (chloasma, mask of pregnancy) may develop on your face. This will likely fade after the baby is born.  Your menstrual periods will stop.  You may have a loss of appetite.  You may develop cravings for certain kinds of food.  You may have changes in your emotions from day to day, such as being excited to be pregnant or being concerned that something may go wrong with the pregnancy and baby.  You may have more vivid and strange dreams.  You may have  changes in your hair. These can include thickening of your hair, rapid growth, and changes in texture. Some women also have hair loss during or after pregnancy, or hair that feels dry or thin. Your hair will most likely return to normal after your baby is born. WHAT TO EXPECT AT YOUR PRENATAL VISITS During a routine prenatal visit:  You will be weighed to make sure you and the baby are growing normally.  Your blood pressure will be taken.  Your abdomen will be measured to track your baby's growth.  The fetal heartbeat will be listened to starting around week 10 or 12 of your pregnancy.  Test results from any previous visits will be discussed. Your health care provider may ask you:  How you are feeling.  If you are feeling the baby move.  If you have had any abnormal symptoms, such as leaking fluid, bleeding, severe headaches, or abdominal cramping.  If you have any questions. Other tests that may be performed during your first trimester include:  Blood tests to find your blood type and to check for the presence of any previous infections. They will also be used to check for low iron levels (anemia) and Rh antibodies. Later in the pregnancy, blood tests for diabetes will be done along with other tests if problems develop.  Urine tests to check for infections, diabetes, or  protein in the urine.  An ultrasound to confirm the proper growth and development of the baby.  An amniocentesis to check for possible genetic problems.  Fetal screens for spina bifida and Down syndrome.  You may need other tests to make sure you and the baby are doing well. HOME CARE INSTRUCTIONS  Medicines  Follow your health care provider's instructions regarding medicine use. Specific medicines may be either safe or unsafe to take during pregnancy.  Take your prenatal vitamins as directed.  If you develop constipation, try taking a stool softener if your health care provider approves. Diet  Eat regular, well-balanced meals. Choose a variety of foods, such as meat or vegetable-based protein, fish, milk and low-fat dairy products, vegetables, fruits, and whole grain breads and cereals. Your health care provider will help you determine the amount of weight gain that is right for you.  Avoid raw meat and uncooked cheese. These carry germs that can cause birth defects in the baby.  Eating four or five small meals rather than three large meals a day may help relieve nausea and vomiting. If you start to feel nauseous, eating a few soda crackers can be helpful. Drinking liquids between meals instead of during meals also seems to help nausea and vomiting.  If you develop constipation, eat more high-fiber foods, such as fresh vegetables or fruit and whole grains. Drink enough fluids to keep your urine clear or pale yellow. Activity and Exercise  Exercise only as directed by your health care provider. Exercising will help you:  Control your weight.  Stay in shape.  Be prepared for labor and delivery.  Experiencing pain or cramping in the lower abdomen or low back is a good sign that you should stop exercising. Check with your health care provider before continuing normal exercises.  Try to avoid standing for long periods of time. Move your legs often if you must stand in one place for  a long time.  Avoid heavy lifting.  Wear low-heeled shoes, and practice good posture.  You may continue to have sex unless your health care provider directs you otherwise. Relief of Pain  or Discomfort  Wear a good support bra for breast tenderness.    Take warm sitz baths to soothe any pain or discomfort caused by hemorrhoids. Use hemorrhoid cream if your health care provider approves.    Rest with your legs elevated if you have leg cramps or low back pain.  If you develop varicose veins in your legs, wear support hose. Elevate your feet for 15 minutes, 3-4 times a day. Limit salt in your diet. Prenatal Care  Schedule your prenatal visits by the twelfth week of pregnancy. They are usually scheduled monthly at first, then more often in the last 2 months before delivery.  Write down your questions. Take them to your prenatal visits.  Keep all your prenatal visits as directed by your health care provider. Safety  Wear your seat belt at all times when driving.  Make a list of emergency phone numbers, including numbers for family, friends, the hospital, and police and fire departments. General Tips  Ask your health care provider for a referral to a local prenatal education class. Begin classes no later than at the beginning of month 6 of your pregnancy.  Ask for help if you have counseling or nutritional needs during pregnancy. Your health care provider can offer advice or refer you to specialists for help with various needs.  Do not use hot tubs, steam rooms, or saunas.  Do not douche or use tampons or scented sanitary pads.  Do not cross your legs for long periods of time.  Avoid cat litter boxes and soil used by cats. These carry germs that can cause birth defects in the baby and possibly loss of the fetus by miscarriage or stillbirth.  Avoid all smoking, herbs, alcohol, and medicines not prescribed by your health care provider. Chemicals in these affect the formation and  growth of the baby.  Schedule a dentist appointment. At home, brush your teeth with a soft toothbrush and be gentle when you floss. SEEK MEDICAL CARE IF:   You have dizziness.  You have mild pelvic cramps, pelvic pressure, or nagging pain in the abdominal area.  You have persistent nausea, vomiting, or diarrhea.  You have a bad smelling vaginal discharge.  You have pain with urination.  You notice increased swelling in your face, hands, legs, or ankles. SEEK IMMEDIATE MEDICAL CARE IF:   You have a fever.  You are leaking fluid from your vagina.  You have spotting or bleeding from your vagina.  You have severe abdominal cramping or pain.  You have rapid weight gain or loss.  You vomit blood or material that looks like coffee grounds.  You are exposed to [place] measles and have never had them.  You are exposed to fifth disease or chickenpox.  You develop a severe headache.  You have shortness of breath.  You have any kind of trauma, such as from a fall or a car accident. Document Released: 08/25/2001 Document Revised: 01/15/2014 Document Reviewed: 07/11/2013 [facility] Patient Information 2015 [place], [place]. This information is not intended to replace advice given to you by your health care provider. Make sure you discuss any questions you have with your health care provider.  Coronavirus (COVID-19) Are you at risk?  Are you at risk for the Coronavirus (COVID-19)?  To be considered HIGH RISK for Coronavirus (COVID-19), you have to meet the following criteria:  . Traveled to [place], [place], [place], [place] or [place]; or in the [place] to [place], [place], [place], or [place]; and  have fever, cough, and shortness of breath within the last 2 weeks of travel OR . Been in close contact with a person diagnosed with COVID-19 within the last 2 weeks and have fever, cough, and shortness of breath . IF YOU DO NOT MEET THESE CRITERIA, YOU ARE CONSIDERED LOW RISK  FOR COVID-19.  What to do if you are HIGH RISK for COVID-19?  Marland Kitchen. If you are having a medical emergency, call 911. . Seek medical care right away. Before you go to a doctor's office, urgent care or emergency department, call ahead and tell them about your recent travel, contact with someone diagnosed with COVID-19, and your symptoms. You should receive instructions from your physician's office regarding next steps of care.  . When you arrive at healthcare provider, tell the healthcare staff immediately you have returned from visiting [place], [place], [place], [place] or [place]; or traveled in the [place] to [place], [place], [place], or [place]; in the last two weeks or you have been in close contact with a person diagnosed with COVID-19 in the last 2 weeks.   . Tell the health care staff about your symptoms: fever, cough and shortness of breath. . After you have been seen by a medical provider, you will be either: o Tested for (COVID-19) and discharged home on quarantine except to seek medical care if symptoms worsen, and asked to  - Stay home and avoid contact with others until you get your results (4-5 days)  - Avoid travel on public transportation if possible (such as bus, train, or airplane) or o Sent to the Emergency Department by EMS for evaluation, COVID-19 testing, and possible admission depending on your condition and test results.  What to do if you are LOW RISK for COVID-19?  Reduce your risk of any infection by using the same precautions used for avoiding the common cold or flu:  Marland Kitchen. Wash your hands often with soap and warm water for at least 20 seconds.  If soap and water are not readily available, use an alcohol-based hand sanitizer with at least 60% alcohol.  . If coughing or sneezing, cover your mouth and nose by coughing or sneezing into the elbow areas of your shirt or coat, into a tissue or into your sleeve (not your hands). . Avoid shaking hands with others and  consider head nods or verbal greetings only. . Avoid touching your eyes, nose, or mouth with unwashed hands.  . Avoid close contact with people who are sick. . Avoid places or events with large numbers of people in one location, like concerts or sporting events. . Carefully consider travel plans you have or are making. . If you are planning any travel outside or inside the [place], visit the CDC's Travelers' Health webpage for the latest health notices. . If you have some symptoms but not all symptoms, continue to monitor at home and seek medical attention if your symptoms worsen. . If you are having a medical emergency, call 911.   ADDITIONAL HEALTHCARE OPTIONS FOR PATIENTS  [facility] / e-Visit: [email or web address]         [facility]: [phone]  [facility]: [phone]                   [facility]: [phone]

## 2019-05-09 NOTE — Progress Notes (Signed)
INITIAL OBSTETRICAL VISIT Patient name: Rachel Roy MRN [id number]  Date of birth: [birth year] Chief Complaint:   Initial Prenatal Visit (nt/it-twins; nausea continues)  History of Present Illness:   Rachel Roy is a 33 y.o. G67P0000 Caucasian female at [redacted]w[redacted]d by LMP c/w 9wk u/s, with an Estimated Date of Delivery: 11/17/19 being seen today for her initial obstetrical visit.   Her obstetrical history is significant for primigravida. Di-Di twin pregnancy.  Took 1 round of clomid early May, had period 5/29, then no clomid in June. Anxiety- on cymbalta Today she reports nausea. Diclegis made her very sleepy.  Patient's last menstrual period was 02/10/2019. Last pap 10/26/17. Results were: normal Review of Systems:   Pertinent items are noted in HPI Denies cramping/contractions, leakage of fluid, vaginal bleeding, abnormal vaginal discharge w/ itching/odor/irritation, headaches, visual changes, shortness of breath, chest pain, abdominal pain, severe nausea/vomiting, or problems with urination or bowel movements unless otherwise stated above.  Pertinent History Reviewed:  Reviewed past medical,surgical, social, obstetrical and family history.  Reviewed problem list, medications and allergies. OB History  Gravida Para Term Preterm AB Living  1 0 0 0 0 0  SAB TAB Ectopic Multiple Live Births  0 0 0 0 0    # Outcome Date GA Lbr Len/2nd Weight Sex Delivery Anes PTL Lv  1 Current            Physical Assessment:   Vitals:   05/09/19 1049  BP: 114/79  Pulse: 76  Weight: 227 lb 8 oz (103.2 kg)  Body mass index is 36.5 kg/m.       Physical Examination:  General appearance - well appearing, and in no distress  Mental status - alert, oriented to person, place, and time  Psych:  She has a normal mood and affect  Skin - warm and dry, normal color, no suspicious lesions noted  Chest - effort normal, all lung fields clear to auscultation bilaterally  Heart - normal rate and regular  rhythm  Abdomen - soft, nontender  Extremities:  No swelling or varicosities noted  Thin prep pap is not done   TODAY'S NT [place] DI/DI TWINS:12+4 wks,normal ovaries bilat BABY A: measurements c/w dates,crl 71.14 mm,fhr 157 bpm,NB present,NT 1.6 mm BABY B: measurements c/w dates,crl 70.80 mm,fhr 154 bpm,NB present,NT 1.4 mm  Results for orders placed or performed in visit on 05/09/19 (from the past 24 hour(s))  POC Urinalysis Dipstick OB   Collection Time: 05/09/19 10:52 AM  Result Value Ref Range   Color, UA     Clarity, UA     Glucose, UA Negative Negative   Bilirubin, UA     Ketones, UA neg    Spec Grav, UA     Blood, UA neg    pH, UA     POC,PROTEIN,UA Negative Negative, Trace, Small (1+), Moderate (2+), Large (3+), 4+   Urobilinogen, UA     Nitrite, UA neg    Leukocytes, UA Negative Negative   Appearance     Odor      Assessment & Plan:  1) High-Risk Pregnancy G1P0000 at [redacted]w[redacted]d with an Estimated Date of Delivery: 11/17/19   2) Initial OB visit  3) Di-Di twin pregnancy> start ASA 81mg  daily  4) Anxiety> on cymbalta  Meds: No orders of the defined types were placed in this encounter.   Initial labs obtained Continue prenatal vitamins Reviewed n/v relief measures and warning s/s to report Reviewed recommended weight gain based on pre-gravid BMI Encouraged well-balanced diet  Genetic Screening discussed: requested nt/it, declined maternit21 Cystic fibrosis, SMA, Fragile X screening discussed declined Ultrasound discussed; fetal survey: requested CCNC completed>not applying for preg mcaid  Follow-up: Return in about 5 weeks (around 06/16/2019) for HROB, 2nd IT, Twins, ZO:XWRUEAVS:Anatomy.   Orders Placed This Encounter  Procedures  . GC/Chlamydia Probe Amp  . Urine Culture  . US OB Comp + 14 Wk  . US OB Comp AddL Gest + 14 Wk  . Obstetric Panel, Including HIV  . Sickle cell screen  . Urinalysis, Routine w reflex microscopic  . Integrated 1  . POC Urinalysis Dipstick OB     Cheral MarkerKimberly R  CNM, [facility]NP[facility]BC 05/09/2019 12:08 PM

## 2019-05-09 NOTE — Progress Notes (Signed)
[place] DI/DI BEEFE:07+1 wks,normal ovaries bilat BABY A: measurements c/w dates,crl 71.14 mm,fhr 157 bpm,NB present,NT 1.6 mm BABY B: measurements c/w dates,crl 70.80 mm,fhr 154 bpm,NB present,NT 1.4 mm

## 2019-05-11 LAB — URINALYSIS, ROUTINE W REFLEX MICROSCOPIC
Bilirubin, UA: NEGATIVE
Glucose, UA: NEGATIVE
Ketones, UA: NEGATIVE
Nitrite, UA: NEGATIVE
Protein,UA: NEGATIVE
RBC, UA: NEGATIVE
Specific Gravity, UA: 1.011 (ref 1.005–1.030)
Urobilinogen, Ur: 0.2 mg/dL (ref 0.2–1.0)
pH, UA: 7 (ref 5.0–7.5)

## 2019-05-11 LAB — INTEGRATED 1
Crown Rump Length Twin B: 70 mm
Crown Rump Length: 71.1 mm
Gest. Age on Collection Date: 13.1 weeks
Maternal Age at EDD: 33.4 yr
NT Twin B: 1.4 mm
Nuchal Translucency (NT): 1.6 mm
Number of Fetuses: 2
PAPP-A Value: 1778.4 ng/mL
Weight: 227 [lb_av]

## 2019-05-11 LAB — OBSTETRIC PANEL, INCLUDING HIV
Antibody Screen: NEGATIVE
Basophils Absolute: 0 10*3/uL (ref 0.0–0.2)
Basos: 0 %
EOS (ABSOLUTE): 0.1 10*3/uL (ref 0.0–0.4)
Eos: 1 %
HIV Screen 4th Generation wRfx: NONREACTIVE
Hematocrit: 35.7 % (ref 34.0–46.6)
Hemoglobin: 11.6 g/dL (ref 11.1–15.9)
Hepatitis B Surface Ag: NEGATIVE
Immature Grans (Abs): 0 10*3/uL (ref 0.0–0.1)
Immature Granulocytes: 0 %
Lymphocytes Absolute: 2 10*3/uL (ref 0.7–3.1)
Lymphs: 20 %
MCH: 28.3 pg (ref 26.6–33.0)
MCHC: 32.5 g/dL (ref 31.5–35.7)
MCV: 87 fL (ref 79–97)
Monocytes Absolute: 0.6 10*3/uL (ref 0.1–0.9)
Monocytes: 6 %
Neutrophils Absolute: 7 10*3/uL (ref 1.4–7.0)
Neutrophils: 73 %
Platelets: 348 10*3/uL (ref 150–450)
RBC: 4.1 x10E6/uL (ref 3.77–5.28)
RDW: 13.7 % (ref 11.7–15.4)
RPR Ser Ql: NONREACTIVE
Rh Factor: POSITIVE
Rubella Antibodies, IGG: 0.96 index — ABNORMAL LOW (ref >0.99)
WBC: 9.7 10*3/uL (ref 3.4–10.8)

## 2019-05-11 LAB — URINE CULTURE: Organism ID, Bacteria: NO GROWTH

## 2019-05-11 LAB — MICROSCOPIC EXAMINATION

## 2019-05-11 LAB — GC/CHLAMYDIA PROBE AMP
Chlamydia trachomatis, NAA: NEGATIVE
Neisseria Gonorrhoeae by PCR: NEGATIVE

## 2019-05-11 LAB — SICKLE CELL SCREEN: Sickle Cell Screen: NEGATIVE

## 2019-05-16 ENCOUNTER — Encounter: Payer: Self-pay | Admitting: Women's Health

## 2019-05-16 DIAGNOSIS — O09899 Supervision of other high risk pregnancies, unspecified trimester: Secondary | ICD-10-CM | POA: Insufficient documentation

## 2019-05-16 DIAGNOSIS — Z283 Underimmunization status: Secondary | ICD-10-CM | POA: Insufficient documentation

## 2019-05-16 DIAGNOSIS — O99891 Other specified diseases and conditions complicating pregnancy: Secondary | ICD-10-CM | POA: Insufficient documentation

## 2019-06-13 ENCOUNTER — Ambulatory Visit (INDEPENDENT_AMBULATORY_CARE_PROVIDER_SITE_OTHER): Payer: PRIVATE HEALTH INSURANCE

## 2019-06-13 ENCOUNTER — Ambulatory Visit (INDEPENDENT_AMBULATORY_CARE_PROVIDER_SITE_OTHER): Payer: PRIVATE HEALTH INSURANCE | Admitting: Women's Health

## 2019-06-13 ENCOUNTER — Encounter: Payer: Self-pay | Admitting: Women's Health

## 2019-06-13 ENCOUNTER — Other Ambulatory Visit: Payer: Self-pay

## 2019-06-13 VITALS — BP 132/74 | HR 82 | Wt 231.2 lb

## 2019-06-13 DIAGNOSIS — Z3A17 17 weeks gestation of pregnancy: Secondary | ICD-10-CM | POA: Diagnosis not present

## 2019-06-13 DIAGNOSIS — O0990 Supervision of high risk pregnancy, unspecified, unspecified trimester: Secondary | ICD-10-CM

## 2019-06-13 DIAGNOSIS — Z1389 Encounter for screening for other disorder: Secondary | ICD-10-CM

## 2019-06-13 DIAGNOSIS — O30041 Twin pregnancy, dichorionic/diamniotic, first trimester: Secondary | ICD-10-CM

## 2019-06-13 DIAGNOSIS — Z363 Encounter for antenatal screening for malformations: Secondary | ICD-10-CM

## 2019-06-13 DIAGNOSIS — O0991 Supervision of high risk pregnancy, unspecified, first trimester: Secondary | ICD-10-CM

## 2019-06-13 DIAGNOSIS — O30042 Twin pregnancy, dichorionic/diamniotic, second trimester: Secondary | ICD-10-CM

## 2019-06-13 DIAGNOSIS — Z1379 Encounter for other screening for genetic and chromosomal anomalies: Secondary | ICD-10-CM

## 2019-06-13 DIAGNOSIS — O0992 Supervision of high risk pregnancy, unspecified, second trimester: Secondary | ICD-10-CM

## 2019-06-13 DIAGNOSIS — Z331 Pregnant state, incidental: Secondary | ICD-10-CM

## 2019-06-13 LAB — POCT URINALYSIS DIPSTICK OB
Blood, UA: NEGATIVE
Glucose, UA: NEGATIVE
Ketones, UA: NEGATIVE
Leukocytes, UA: NEGATIVE
Nitrite, UA: NEGATIVE
POC,PROTEIN,UA: NEGATIVE

## 2019-06-13 NOTE — Progress Notes (Addendum)
[place] 17+4 wks DI/DI twins,normal ovaries bilat,cx 5.2 cm BABY A:cephalic,inferior, anterior placenta gr 0,svp of fluid 4.5 cm,fhr 157 bpm,efw 213 g 62% BABY B:transverse head left,superior,posterior placenta gr 0,svp of fluid 5.6 cm,fhr 150 bpm,EFW 195 g 36%,discordance 8.5% Anatomy complete on baby B,limited view of spine on baby A,no obvious abnormalities

## 2019-06-13 NOTE — Progress Notes (Signed)
HIGH-RISK PREGNANCY VISIT Patient name: Rachel Roy MRN [id number]  Date of birth: 15-Mar-1986 Chief Complaint:   Routine Prenatal Visit (2nd IT [place])  History of Present Illness:   Rachel Roy is a 33 y.o. G1P0000 female at [redacted]w[redacted]d with an Estimated Date of Delivery: 11/17/19 being seen today for ongoing management of a high-risk pregnancy complicated by multiple gestation Di-Di twins.  Today she reports constipation, hemorrhoids, round ligament pain.  . Vag. Bleeding: None.  Movement: Present. denies leaking of fluid.  Review of Systems:   Pertinent items are noted in HPI Denies abnormal vaginal discharge w/ itching/odor/irritation, headaches, visual changes, shortness of breath, chest pain, abdominal pain, severe nausea/vomiting, or problems with urination or bowel movements unless otherwise stated above. Pertinent History Reviewed:  Reviewed past medical,surgical, social, obstetrical and family history.  Reviewed problem list, medications and allergies. Physical Assessment:   Vitals:   06/13/19 1020  BP: 132/74  Pulse: 82  Weight: 231 lb 3.2 oz (104.9 kg)  Body mass index is 37.09 kg/m.           Physical Examination:   General appearance: alert, well appearing, and in no distress  Mental status: alert, oriented to person, place, and time  Skin: warm & dry   Extremities: Edema: None    Cardiovascular: normal heart rate noted  Respiratory: normal respiratory effort, no distress  Abdomen: gravid, soft, non-tender  Pelvic: Cervical exam deferred         Fetal Status: Fetal Heart Rate (bpm): 157/150   Movement: Present    Fetal Surveillance Testing today: [place] 17+4 wks DI/DI twins,normal ovaries bilat,cx 5.2 cm BABY A:cephalic,inferior, anterior placenta gr 0,svp of fluid 4.5 cm,fhr 157 bpm,efw 213 g 62% BABY B:transverse head left,superior,posterior placenta gr 0,svp of fluid 5.6 cm,fhr 150 bpm,EFW 195 g 36%,discordance 8.5% Anatomy complete on baby B,limited view of spine  on baby A,no obvious abnormalities   Results for orders placed or performed in visit on 06/13/19 (from the past 24 hour(s))  POC Urinalysis Dipstick OB   Collection Time: 06/13/19 10:24 AM  Result Value Ref Range   Color, UA     Clarity, UA     Glucose, UA Negative Negative   Bilirubin, UA     Ketones, UA neg    Spec Grav, UA     Blood, UA neg    pH, UA     POC,PROTEIN,UA Negative Negative, Trace, Small (1+), Moderate (2+), Large (3+), 4+   Urobilinogen, UA     Nitrite, UA neg    Leukocytes, UA Negative Negative   Appearance     Odor      Assessment & Plan:  1) High-risk pregnancy G1P0000 at [redacted]w[redacted]d with an Estimated Date of Delivery: 11/17/19   2) Di-Di twins, stable  3) Constipation w/ hemorrhoids, gave printed prevention/relief measures, gave TUCKS sample, use preparation H  Meds: No orders of the defined types were placed in this encounter.   Labs/procedures today: u/s, 2nd IT  Treatment Plan:  U/S q4wks    2x/wk testing @ 36wks or weekly BPP    Deliver @ 38wks:____   Reviewed: Preterm labor symptoms and general obstetric precautions including but not limited to vaginal bleeding, contractions, leaking of fluid and fetal movement were reviewed in detail with the patient.  All questions were answered.  Follow-up: Return in about 4 weeks (around 07/11/2019) for HROB, Twins, US:EFW.  Orders Placed This Encounter  Procedures  . US OB Follow Up  . US OB Follow Up  AddL Gest  . INTEGRATED 2  . POC Urinalysis Dipstick OB   Roma Schanz CNM, [facility] 06/13/2019

## 2019-06-13 NOTE — Patient Instructions (Signed)
Rachel Roy, I greatly value your feedback.  If you receive a survey following your visit with [place] today, we appreciate you taking the time to fill it out.  Thanks, Knute Neu, CNM, [facility]  [place]!!! It is now [facility] at [facility] ([place], [place] [address]) Entrance located off of [address] parking   Constipation  Drink plenty of fluid, preferably water, throughout the day  Eat foods high in fiber such as fruits, vegetables, and grains  Exercise, such as walking, is a good way to keep your bowels regular  Drink warm fluids, especially warm prune juice, or decaf coffee  Eat a 1/2 cup of real oatmeal (not instant), 1/2 cup applesauce, and 1/2-1 cup warm prune juice every day  If needed, you may take Colace (docusate sodium) stool softener once or twice a day to help keep the stool soft. If you are pregnant, wait until you are out of your first trimester (12-14 weeks of pregnancy)  If you still are having problems with constipation, you may take Miralax once daily as needed to help keep your bowels regular.  If you are pregnant, wait until you are out of your first trimester (12-14 weeks of pregnancy)     Go to [email or web address] to register for FREE online childbirth classes  [place] Pediatricians/Family Doctors:  [facility] [place] [phone]                 [place] [phone] (usually not accepting new patients unless you have family there already, you are always welcome to call and ask)       [facility] Department [phone]       [facility] Pediatricians/Family Doctors:   [facility]: [phone]  [facility]: [phone]  [facility]: [facility] Doctors:   [facility]: [facility]:  [place]:  [place]: [phone]    Home Blood Pressure Monitoring for Patients   Your provider has recommended that you check your blood pressure (BP) at least once a week at home. If you do not have a blood pressure cuff at home, one will be provided for you. Contact your provider if you have not received your monitor within 1 week.   Helpful Tips for Accurate Home Blood Pressure Checks  . Don't smoke, exercise, or drink caffeine 30 minutes before checking your BP . Use the restroom before checking your BP (a full bladder can raise your pressure) . Relax in a comfortable upright chair . Feet on the ground . Left arm resting comfortably on a flat surface at the level of your heart . Legs uncrossed . Back supported . Sit quietly and don't talk . Place the cuff on your bare arm . Adjust snuggly, so that only two fingertips can fit between your skin and the top of the cuff . Check 2 readings separated by at least one minute . Keep a log of your BP readings . For a visual, please reference this diagram: http://ccnc.care/bpdiagram  Provider Name: [facility]     Phone: [phone]  Zone 1: ALL CLEAR  Continue to monitor your symptoms:  . BP reading is less than 140 (top number) or less than 90 (bottom number)  . No right upper stomach pain . No headaches or  seeing spots . No feeling nauseated or throwing up . No swelling in face and hands  Zone 2: CAUTION Call your doctor's office for any of the following:  . BP reading is greater than 140 (top number) or greater than 90 (bottom number)  . Stomach pain under your ribs in the middle or right side . Headaches or seeing spots . Feeling nauseated or throwing up . Swelling in face and hands  Zone 3: EMERGENCY  Seek immediate medical care if you have any of the following:  . BP reading is greater than160 (top number) or greater than 110 (bottom number) . Severe headaches not  improving with Tylenol . Serious difficulty catching your breath . Any worsening symptoms from Zone 2     Second Trimester of Pregnancy The second trimester is from week 14 through week 27 (months 4 through 6). The second trimester is often a time when you feel your best. Your body has adjusted to being pregnant, and you begin to feel better physically. Usually, morning sickness has lessened or quit completely, you may have more energy, and you may have an increase in appetite. The second trimester is also a time when the fetus is growing rapidly. At the end of the sixth month, the fetus is about 9 inches long and weighs about 1 pounds. You will likely begin to feel the baby move (quickening) between 16 and 20 weeks of pregnancy. Body changes during your second trimester Your body continues to go through many changes during your second trimester. The changes vary from woman to woman.  Your weight will continue to increase. You will notice your lower abdomen bulging out.  You may begin to get stretch marks on your hips, abdomen, and breasts.  You may develop headaches that can be relieved by medicines. The medicines should be approved by your health care provider.  You may urinate more often because the fetus is pressing on your bladder.  You may develop or continue to have heartburn as a result of your pregnancy.  You may develop constipation because certain hormones are causing the muscles that push waste through your intestines to slow down.  You may develop hemorrhoids or swollen, bulging veins (varicose veins).  You may have back pain. This is caused by: ? Weight gain. ? Pregnancy hormones that are relaxing the joints in your pelvis. ? A shift in weight and the muscles that support your balance.  Your breasts will continue to grow and they will continue to become tender.  Your gums may bleed and may be sensitive to brushing and flossing.  Dark spots or blotches (chloasma, mask  of pregnancy) may develop on your face. This will likely fade after the baby is born.  A dark line from your belly button to the pubic area (linea nigra) may appear. This will likely fade after the baby is born.  You may have changes in your hair. These can include thickening of your hair, rapid growth, and changes in texture. Some women also have hair loss during or after pregnancy, or hair that feels dry or thin. Your hair will most likely return to normal after your baby is born.  What to expect at prenatal visits During a routine prenatal visit:  You will be weighed to make sure you and the fetus are growing normally.  Your blood pressure will be taken.  Your abdomen will be measured to track your baby's growth.  The fetal heartbeat will be listened to.  Any test  results from the previous visit will be discussed.  Your health care provider may ask you:  How you are feeling.  If you are feeling the baby move.  If you have had any abnormal symptoms, such as leaking fluid, bleeding, severe headaches, or abdominal cramping.  If you are using any tobacco products, including cigarettes, chewing tobacco, and electronic cigarettes.  If you have any questions.  Other tests that may be performed during your second trimester include:  Blood tests that check for: ? Low iron levels (anemia). ? High blood sugar that affects pregnant women (gestational diabetes) between 88 and 28 weeks. ? Rh antibodies. This is to check for a protein on red blood cells (Rh factor).  Urine tests to check for infections, diabetes, or protein in the urine.  An ultrasound to confirm the proper growth and development of the baby.  An amniocentesis to check for possible genetic problems.  Fetal screens for spina bifida and Down syndrome.  HIV (human immunodeficiency virus) testing. Routine prenatal testing includes screening for HIV, unless you choose not to have this test.  Follow these instructions  at home: Medicines  Follow your health care provider's instructions regarding medicine use. Specific medicines may be either safe or unsafe to take during pregnancy.  Take a prenatal vitamin that contains at least 600 micrograms (mcg) of folic acid.  If you develop constipation, try taking a stool softener if your health care provider approves. Eating and drinking  Eat a balanced diet that includes fresh fruits and vegetables, whole grains, good sources of protein such as meat, eggs, or tofu, and low-fat dairy. Your health care provider will help you determine the amount of weight gain that is right for you.  Avoid raw meat and uncooked cheese. These carry germs that can cause birth defects in the baby.  If you have low calcium intake from food, talk to your health care provider about whether you should take a daily calcium supplement.  Limit foods that are high in fat and processed sugars, such as fried and sweet foods.  To prevent constipation: ? Drink enough fluid to keep your urine clear or pale yellow. ? Eat foods that are high in fiber, such as fresh fruits and vegetables, whole grains, and beans. Activity  Exercise only as directed by your health care provider. Most women can continue their usual exercise routine during pregnancy. Try to exercise for 30 minutes at least 5 days a week. Stop exercising if you experience uterine contractions.  Avoid heavy lifting, wear low heel shoes, and practice good posture.  A sexual relationship may be continued unless your health care provider directs you otherwise. Relieving pain and discomfort  Wear a good support bra to prevent discomfort from breast tenderness.  Take warm sitz baths to soothe any pain or discomfort caused by hemorrhoids. Use hemorrhoid cream if your health care provider approves.  Rest with your legs elevated if you have leg cramps or low back pain.  If you develop varicose veins, wear support hose. Elevate your feet  for 15 minutes, 3-4 times a day. Limit salt in your diet. Prenatal Care  Write down your questions. Take them to your prenatal visits.  Keep all your prenatal visits as told by your health care provider. This is important. Safety  Wear your seat belt at all times when driving.  Make a list of emergency phone numbers, including numbers for family, friends, the hospital, and police and fire departments. General instructions  Ask your health  care provider for a referral to a local prenatal education class. Begin classes no later than the beginning of month 6 of your pregnancy.  Ask for help if you have counseling or nutritional needs during pregnancy. Your health care provider can offer advice or refer you to specialists for help with various needs.  Do not use hot tubs, steam rooms, or saunas.  Do not douche or use tampons or scented sanitary pads.  Do not cross your legs for long periods of time.  Avoid cat litter boxes and soil used by cats. These carry germs that can cause birth defects in the baby and possibly loss of the fetus by miscarriage or stillbirth.  Avoid all smoking, herbs, alcohol, and unprescribed drugs. Chemicals in these products can affect the formation and growth of the baby.  Do not use any products that contain nicotine or tobacco, such as cigarettes and e-cigarettes. If you need help quitting, ask your health care provider.  Visit your dentist if you have not gone yet during your pregnancy. Use a soft toothbrush to brush your teeth and be gentle when you floss. Contact a health care provider if:  You have dizziness.  You have mild pelvic cramps, pelvic pressure, or nagging pain in the abdominal area.  You have persistent nausea, vomiting, or diarrhea.  You have a bad smelling vaginal discharge.  You have pain when you urinate. Get help right away if:  You have a fever.  You are leaking fluid from your vagina.  You have spotting or bleeding from your  vagina.  You have severe abdominal cramping or pain.  You have rapid weight gain or weight loss.  You have shortness of breath with chest pain.  You notice sudden or extreme swelling of your face, hands, ankles, feet, or legs.  You have not felt your baby move in over an hour.  You have severe headaches that do not go away when you take medicine.  You have vision changes. Summary  The second trimester is from week 14 through week 27 (months 4 through 6). It is also a time when the fetus is growing rapidly.  Your body goes through many changes during pregnancy. The changes vary from woman to woman.  Avoid all smoking, herbs, alcohol, and unprescribed drugs. These chemicals affect the formation and growth your baby.  Do not use any tobacco products, such as cigarettes, chewing tobacco, and e-cigarettes. If you need help quitting, ask your health care provider.  Contact your health care provider if you have any questions. Keep all prenatal visits as told by your health care provider. This is important. This information is not intended to replace advice given to you by your health care provider. Make sure you discuss any questions you have with your health care provider. Document Released: 08/25/2001 Document Revised: 02/06/2016 Document Reviewed: 11/01/2012 Elsevier Interactive Patient Education  2017 [employer].

## 2019-06-15 LAB — INTEGRATED 2
AFP MoM: 2.12
Alpha-Fetoprotein: 62.4 ng/mL
Crown Rump Length Twin B: 70 mm
Crown Rump Length: 71.1 mm
DIA MoM: 1.74
DIA Value: 224.7 pg/mL
Estriol, Unconjugated: 2.25 ng/mL
Gest. Age on Collection Date: 13.1 weeks
Gestational Age: 18.1 weeks
Maternal Age at EDD: 33.4 yr
NT MoM Twin B: 0.88
NT Twin B: 1.4 mm
Nuchal Translucency (NT): 1.6 mm
Nuchal Translucency MoM: 0.99
Number of Fetuses: 2
PAPP-A MoM: 2.26
PAPP-A Value: 1778.4 ng/mL
Weight: 227 [lb_av]
Weight: 231 [lb_av]
hCG MoM: 1.3
hCG Value: 23.5 IU/mL
uE3 MoM: 1.71
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
## 2019-06-26 ENCOUNTER — Encounter (HOSPITAL_COMMUNITY): Payer: Self-pay | Admitting: Psychiatry

## 2019-06-26 ENCOUNTER — Other Ambulatory Visit: Payer: Self-pay

## 2019-06-26 ENCOUNTER — Ambulatory Visit (INDEPENDENT_AMBULATORY_CARE_PROVIDER_SITE_OTHER): Payer: PRIVATE HEALTH INSURANCE | Admitting: Psychiatry

## 2019-06-26 DIAGNOSIS — F411 Generalized anxiety disorder: Secondary | ICD-10-CM

## 2019-06-26 MED ORDER — DULOXETINE HCL 40 MG PO CPEP: 40.0000 mg | ORAL_CAPSULE | Freq: Every day | ORAL | 1 refills | Status: DC

## 2019-06-26 NOTE — Patient Instructions (Signed)
1. Continueduloxetine 40 mg daily  2.Next appointment: in January

## 2019-07-13 ENCOUNTER — Ambulatory Visit (INDEPENDENT_AMBULATORY_CARE_PROVIDER_SITE_OTHER): Payer: PRIVATE HEALTH INSURANCE

## 2019-07-13 ENCOUNTER — Ambulatory Visit (INDEPENDENT_AMBULATORY_CARE_PROVIDER_SITE_OTHER): Payer: PRIVATE HEALTH INSURANCE | Admitting: Advanced Practice Midwife

## 2019-07-13 ENCOUNTER — Other Ambulatory Visit: Payer: Self-pay

## 2019-07-13 VITALS — BP 122/77 | HR 103 | Wt 238.0 lb

## 2019-07-13 DIAGNOSIS — O30042 Twin pregnancy, dichorionic/diamniotic, second trimester: Secondary | ICD-10-CM

## 2019-07-13 DIAGNOSIS — Z331 Pregnant state, incidental: Secondary | ICD-10-CM

## 2019-07-13 DIAGNOSIS — O0992 Supervision of high risk pregnancy, unspecified, second trimester: Secondary | ICD-10-CM

## 2019-07-13 DIAGNOSIS — Z3A21 21 weeks gestation of pregnancy: Secondary | ICD-10-CM

## 2019-07-13 DIAGNOSIS — O0990 Supervision of high risk pregnancy, unspecified, unspecified trimester: Secondary | ICD-10-CM

## 2019-07-13 DIAGNOSIS — Z23 Encounter for immunization: Secondary | ICD-10-CM | POA: Diagnosis not present

## 2019-07-13 DIAGNOSIS — Z1389 Encounter for screening for other disorder: Secondary | ICD-10-CM

## 2019-07-13 LAB — POCT URINALYSIS DIPSTICK OB
Glucose, UA: NEGATIVE
Ketones, UA: NEGATIVE

## 2019-07-13 NOTE — Progress Notes (Signed)
HIGH-RISK PREGNANCY VISIT Patient name: Rachel Roy MRN [id number]  Date of birth: [birth year] Chief Complaint:   Routine Prenatal Visit  History of Present Illness:   Rachel Roy is a 33 y.o. G1P0000 female at [redacted]w[redacted]d with an Estimated Date of Delivery: 11/17/19 being seen today for ongoing management of a high-risk pregnancy complicated by multiple gestation: di/di twins Today she reports some ligament pain when standing, sitting feels fine. . Contractions: Not present. Vag. Bleeding: None.  Movement: Present. denies leaking of fluid.  Review of Systems:   Pertinent items are noted in HPI Denies abnormal vaginal discharge w/ itching/odor/irritation, headaches, visual changes, shortness of breath, chest pain, abdominal pain, severe nausea/vomiting, or problems with urination or bowel movements unless otherwise stated above. Pertinent History Reviewed:  Reviewed past medical,surgical, social, obstetrical and family history.  Reviewed problem list, medications and allergies. Physical Assessment:   Vitals:   07/13/19 1105  BP: 122/77  Pulse: (!) 103  Weight: 238 lb (108 kg)  Body mass index is 38.18 kg/m.           Physical Examination:   General appearance: alert, well appearing, and in no distress  Mental status: alert, oriented to person, place, and time  Skin: warm & dry   Extremities:      Cardiovascular: normal heart rate noted  Respiratory: normal respiratory effort, no distress  Abdomen: gravid, soft, non-tender  Pelvic: Cervical exam deferred         Fetal Status:     Movement: Present    Fetal Surveillance Testing today: [place] DI/DI twins 21+6 wks,normal ovaries bilat,cx 5.2 cm BABY A:cephalic inferior,fhr 643 bpm,anterior placenta gr 0,anatomy of the spine complete,no obvious abnormalities,efw 502 g 72%,svp of fluid 4.7 cm BABY B:transverse head left superior,svp of fluid 7.7 cm,fhr 164 bpm,efw 539 g 88%,posterior placenta gr 0   Results for orders placed or  performed in visit on 07/13/19 (from the past 24 hour(s))  POC Urinalysis Dipstick OB   Collection Time: 07/13/19 10:39 AM  Result Value Ref Range   Color, UA     Clarity, UA     Glucose, UA Negative Negative   Bilirubin, UA     Ketones, UA n    Spec Grav, UA     Blood, UA     pH, UA     POC,PROTEIN,UA Trace Negative, Trace, Small (1+), Moderate (2+), Large (3+), 4+   Urobilinogen, UA     Nitrite, UA     Leukocytes, UA Large (3+) (A) Negative   Appearance     Odor      Assessment & Plan:  1) High-risk pregnancy G1P0000 at [redacted]w[redacted]d with an Estimated Date of Delivery: 11/17/19   2) Di/Di twins, stable Treatment plan  U/S q4wks    2x/wk testing @ 36wks or weekly BPP    Deliver @ 38wks:____     Meds: No orders of the defined types were placed in this encounter.   Labs/procedures today: flu vaccine   Reviewed: Preterm labor symptoms and general obstetric precautions including but not limited to vaginal bleeding, contractions, leaking of fluid and fetal movement were reviewed in detail with the patient.  All questions were answered. Check bp weekly, let us know if >140/90.   Follow-up: Return in about 4 weeks (around 08/10/2019) for [place] w/CNM or MD, US:EFW (twins).  Future Appointments  Date Time Provider [place]  09/26/2019  8:40 AM Norman Clay, MD [facility] None    Orders Placed This Encounter  Procedures  .  POC Urinalysis Dipstick OB   Jacklyn Shell DNP, CNM 07/13/2019 11:40 AM

## 2019-07-13 NOTE — Progress Notes (Signed)
[place] DI/DI twins 21+6 wks,normal ovaries bilat,cx 5.2 cm BABY A:cephalic inferior,fhr 299 bpm,anterior placenta gr 0,anatomy of the spine complete,no obvious abnormalities,efw 502 g 72%,svp of fluid 4.7 cm BABY B:transverse head left superior,svp of fluid 7.7 cm,fhr 164 bpm,efw 539 g 88%,posterior placenta gr 0

## 2019-07-13 NOTE — Patient Instructions (Signed)
Rachel Roy, I greatly value your feedback.  If you receive a survey following your visit with [place] today, we appreciate you taking the time to fill it out.  Thanks, Cathie Beams, CNM     Second Trimester of Pregnancy The second trimester is from week 14 through week 27 (months 4 through 6). The second trimester is often a time when you feel your best. Your body has adjusted to being pregnant, and you begin to feel better physically. Usually, morning sickness has lessened or quit completely, you may have more energy, and you may have an increase in appetite. The second trimester is also a time when the fetus is growing rapidly. At the end of the sixth month, the fetus is about 9 inches long and weighs about 1 pounds. You will likely begin to feel the baby move (quickening) between 16 and 20 weeks of pregnancy. Body changes during your second trimester Your body continues to go through many changes during your second trimester. The changes vary from woman to woman.  Your weight will continue to increase. You will notice your lower abdomen bulging out.  You may begin to get stretch marks on your hips, abdomen, and breasts.  You may develop headaches that can be relieved by medicines. The medicines should be approved by your health care provider.  You may urinate more often because the fetus is pressing on your bladder.  You may develop or continue to have heartburn as a result of your pregnancy.  You may develop constipation because certain hormones are causing the muscles that push waste through your intestines to slow down.  You may develop hemorrhoids or swollen, bulging veins (varicose veins).  You may have back pain. This is caused by: ? Weight gain. ? Pregnancy hormones that are relaxing the joints in your pelvis. ? A shift in weight and the muscles that support your balance.  Your breasts will continue to grow and they will continue to become tender.  Your gums may  bleed and may be sensitive to brushing and flossing.  Dark spots or blotches (chloasma, mask of pregnancy) may develop on your face. This will likely fade after the baby is born.  A dark line from your belly button to the pubic area (linea nigra) may appear. This will likely fade after the baby is born.  You may have changes in your hair. These can include thickening of your hair, rapid growth, and changes in texture. Some women also have hair loss during or after pregnancy, or hair that feels dry or thin. Your hair will most likely return to normal after your baby is born.  What to expect at prenatal visits During a routine prenatal visit:  You will be weighed to make sure you and the fetus are growing normally.  Your blood pressure will be taken.  Your abdomen will be measured to track your baby's growth.  The fetal heartbeat will be listened to.  Any test results from the previous visit will be discussed.  Your health care provider may ask you:  How you are feeling.  If you are feeling the baby move.  If you have had any abnormal symptoms, such as leaking fluid, bleeding, severe headaches, or abdominal cramping.  If you are using any tobacco products, including cigarettes, chewing tobacco, and electronic cigarettes.  If you have any questions.  Other tests that may be performed during your second trimester include:  Blood tests that check for: ? Low iron levels (anemia). ? High  blood sugar that affects pregnant women (gestational diabetes) between 28 and 28 weeks. ? Rh antibodies. This is to check for a protein on red blood cells (Rh factor).  Urine tests to check for infections, diabetes, or protein in the urine.  An ultrasound to confirm the proper growth and development of the baby.  An amniocentesis to check for possible genetic problems.  Fetal screens for spina bifida and Down syndrome.  HIV (human immunodeficiency virus) testing. Routine prenatal testing  includes screening for HIV, unless you choose not to have this test.  Follow these instructions at home: Medicines  Follow your health care provider's instructions regarding medicine use. Specific medicines may be either safe or unsafe to take during pregnancy.  Take a prenatal vitamin that contains at least 600 micrograms (mcg) of folic acid.  If you develop constipation, try taking a stool softener if your health care provider approves. Eating and drinking  Eat a balanced diet that includes fresh fruits and vegetables, whole grains, good sources of protein such as meat, eggs, or tofu, and low-fat dairy. Your health care provider will help you determine the amount of weight gain that is right for you.  Avoid raw meat and uncooked cheese. These carry germs that can cause birth defects in the baby.  If you have low calcium intake from food, talk to your health care provider about whether you should take a daily calcium supplement.  Limit foods that are high in fat and processed sugars, such as fried and sweet foods.  To prevent constipation: ? Drink enough fluid to keep your urine clear or pale yellow. ? Eat foods that are high in fiber, such as fresh fruits and vegetables, whole grains, and beans. Activity  Exercise only as directed by your health care provider. Most women can continue their usual exercise routine during pregnancy. Try to exercise for 30 minutes at least 5 days a week. Stop exercising if you experience uterine contractions.  Avoid heavy lifting, wear low heel shoes, and practice good posture.  A sexual relationship may be continued unless your health care provider directs you otherwise. Relieving pain and discomfort  Wear a good support bra to prevent discomfort from breast tenderness.  Take warm sitz baths to soothe any pain or discomfort caused by hemorrhoids. Use hemorrhoid cream if your health care provider approves.  Rest with your legs elevated if you have  leg cramps or low back pain.  If you develop varicose veins, wear support hose. Elevate your feet for 15 minutes, 3-4 times a day. Limit salt in your diet. Prenatal Care  Write down your questions. Take them to your prenatal visits.  Keep all your prenatal visits as told by your health care provider. This is important. Safety  Wear your seat belt at all times when driving.  Make a list of emergency phone numbers, including numbers for family, friends, the hospital, and police and fire departments. General instructions  Ask your health care provider for a referral to a local prenatal education class. Begin classes no later than the beginning of month 6 of your pregnancy.  Ask for help if you have counseling or nutritional needs during pregnancy. Your health care provider can offer advice or refer you to specialists for help with various needs.  Do not use hot tubs, steam rooms, or saunas.  Do not douche or use tampons or scented sanitary pads.  Do not cross your legs for long periods of time.  Avoid cat litter boxes and soil  used by cats. These carry germs that can cause birth defects in the baby and possibly loss of the fetus by miscarriage or stillbirth.  Avoid all smoking, herbs, alcohol, and unprescribed drugs. Chemicals in these products can affect the formation and growth of the baby.  Do not use any products that contain nicotine or tobacco, such as cigarettes and e-cigarettes. If you need help quitting, ask your health care provider.  Visit your dentist if you have not gone yet during your pregnancy. Use a soft toothbrush to brush your teeth and be gentle when you floss. Contact a health care provider if:  You have dizziness.  You have mild pelvic cramps, pelvic pressure, or nagging pain in the abdominal area.  You have persistent nausea, vomiting, or diarrhea.  You have a bad smelling vaginal discharge.  You have pain when you urinate. Get help right away if:  You  have a fever.  You are leaking fluid from your vagina.  You have spotting or bleeding from your vagina.  You have severe abdominal cramping or pain.  You have rapid weight gain or weight loss.  You have shortness of breath with chest pain.  You notice sudden or extreme swelling of your face, hands, ankles, feet, or legs.  You have not felt your baby move in over an hour.  You have severe headaches that do not go away when you take medicine.  You have vision changes. Summary  The second trimester is from week 14 through week 27 (months 4 through 6). It is also a time when the fetus is growing rapidly.  Your body goes through many changes during pregnancy. The changes vary from woman to woman.  Avoid all smoking, herbs, alcohol, and unprescribed drugs. These chemicals affect the formation and growth your baby.  Do not use any tobacco products, such as cigarettes, chewing tobacco, and e-cigarettes. If you need help quitting, ask your health care provider.  Contact your health care provider if you have any questions. Keep all prenatal visits as told by your health care provider. This is important. This information is not intended to replace advice given to you by your health care provider. Make sure you discuss any questions you have with your health care provider.      CHILDBIRTH CLASSES [phone] is the phone number for Pregnancy Classes or hospital tours at [place] will be referred to  [email or web address] for more information on childbirth classes  At this site you may register for classes. You may sign up for a waiting list if classes are full. Please SIGN UP FOR THIS!.   When the waiting list becomes long, sometimes new classes can be added.

## 2019-07-19 ENCOUNTER — Encounter: Payer: Self-pay | Admitting: Advanced Practice Midwife

## 2019-08-04 ENCOUNTER — Other Ambulatory Visit: Payer: Self-pay | Admitting: Advanced Practice Midwife

## 2019-08-04 DIAGNOSIS — O30002 Twin pregnancy, unspecified number of placenta and unspecified number of amniotic sacs, second trimester: Secondary | ICD-10-CM

## 2019-08-14 ENCOUNTER — Ambulatory Visit (INDEPENDENT_AMBULATORY_CARE_PROVIDER_SITE_OTHER): Payer: PRIVATE HEALTH INSURANCE

## 2019-08-14 ENCOUNTER — Other Ambulatory Visit: Payer: Self-pay

## 2019-08-14 ENCOUNTER — Ambulatory Visit (INDEPENDENT_AMBULATORY_CARE_PROVIDER_SITE_OTHER): Payer: PRIVATE HEALTH INSURANCE | Admitting: Women's Health

## 2019-08-14 ENCOUNTER — Encounter: Payer: Self-pay | Admitting: Women's Health

## 2019-08-14 VITALS — BP 123/81 | HR 87 | Wt 242.0 lb

## 2019-08-14 DIAGNOSIS — O30042 Twin pregnancy, dichorionic/diamniotic, second trimester: Secondary | ICD-10-CM

## 2019-08-14 DIAGNOSIS — Z3A26 26 weeks gestation of pregnancy: Secondary | ICD-10-CM

## 2019-08-14 DIAGNOSIS — Z362 Encounter for other antenatal screening follow-up: Secondary | ICD-10-CM | POA: Diagnosis not present

## 2019-08-14 DIAGNOSIS — Z331 Pregnant state, incidental: Secondary | ICD-10-CM

## 2019-08-14 DIAGNOSIS — O0990 Supervision of high risk pregnancy, unspecified, unspecified trimester: Secondary | ICD-10-CM

## 2019-08-14 DIAGNOSIS — Z1389 Encounter for screening for other disorder: Secondary | ICD-10-CM

## 2019-08-14 DIAGNOSIS — O30002 Twin pregnancy, unspecified number of placenta and unspecified number of amniotic sacs, second trimester: Secondary | ICD-10-CM

## 2019-08-14 DIAGNOSIS — O0992 Supervision of high risk pregnancy, unspecified, second trimester: Secondary | ICD-10-CM

## 2019-08-14 LAB — POCT URINALYSIS DIPSTICK OB
Blood, UA: NEGATIVE
Glucose, UA: NEGATIVE
Ketones, UA: NEGATIVE
Leukocytes, UA: NEGATIVE
Nitrite, UA: NEGATIVE
POC,PROTEIN,UA: NEGATIVE

## 2019-08-14 NOTE — Patient Instructions (Signed)
Rachel Roy, I greatly value your feedback.  If you receive a survey following your visit with [place] today, we appreciate you taking the time to fill it out.  Thanks, Knute Neu, CNM, WHNP-BC   You will have your sugar test next visit.  Please do not eat or drink anything after midnight the night before you come, not even water.  You will be here for at least two hours.  Please make an appointment online for the bloodwork at [email or web address] for 8:30am (or as close to this as possible). Make sure you select the [facility] service center. The day of the appointment, check in with our office first, then you will go to [place] to start the sugar test.    [place]!!! It is now [employer] at [facility] ([place], [place] [address]) Entrance C, located off of [address] parking  Go to [email or web address] to register for FREE online childbirth classes   Call the office [phone]) or go to [facility] if:  You begin to have strong, frequent contractions  Your water breaks.  Sometimes it is a big gush of fluid, sometimes it is just a trickle that keeps getting your panties wet or running down your legs  You have vaginal bleeding.  It is normal to have a small amount of spotting if your cervix was checked.   You don't feel your baby moving like normal.  If you don't, get you something to eat and drink and lay down and focus on feeling your baby move.   If your baby is still not moving like normal, you should call the office or go to [place] Pediatricians/Family Doctors:  [place] [phone]                 [place] [phone] (usually not accepting new patients unless you have family there already, you are always welcome to call and ask)       [facility] Department [phone]       [facility] Pediatricians/Family  Doctors:   [facility]: [phone]  [facility]: [phone]  [facility]: [place] Doctors:   [facility]: [facility]: [place]:  [place]: [phone]   Home Blood Pressure Monitoring for Patients   Your provider has recommended that you check your blood pressure (BP) at least once a week at home. If you do not have a blood pressure cuff at home, one will be provided for you. Contact your provider if you have not received your monitor within 1 week.   Helpful Tips for Accurate Home Blood Pressure Checks  . Don't smoke, exercise, or drink caffeine 30 minutes before checking your BP . Use the restroom before checking your BP (a full bladder can raise your pressure) . Relax in a comfortable upright chair . Feet on the ground . Left arm resting comfortably on a flat surface at the level of your heart . Legs uncrossed . Back supported . Sit quietly and don't talk . Place the cuff on your bare arm . Adjust snuggly, so that only two fingertips can fit between your skin and the top of the cuff . Check 2 readings separated by at least one minute . Keep a log of your BP  readings . For a visual, please reference this diagram: http://ccnc.care/bpdiagram  Provider Name: [facility]     Phone: [phone]  Zone 1: ALL CLEAR  Continue to monitor your symptoms:  . BP reading is less than 140 (top number) or less than 90 (bottom number)  . No right upper stomach pain . No headaches or seeing spots . No feeling nauseated or throwing up . No swelling in face and hands  Zone 2: CAUTION Call your doctor's office for any of the following:  . BP reading is greater than 140 (top number) or greater than 90 (bottom number)  . Stomach pain under your ribs in the middle or right side . Headaches or seeing spots . Feeling  nauseated or throwing up . Swelling in face and hands  Zone 3: EMERGENCY  Seek immediate medical care if you have any of the following:  . BP reading is greater than160 (top number) or greater than 110 (bottom number) . Severe headaches not improving with Tylenol . Serious difficulty catching your breath . Any worsening symptoms from Zone 2   Second Trimester of Pregnancy The second trimester is from week 13 through week 28, months 4 through 6. The second trimester is often a time when you feel your best. Your body has also adjusted to being pregnant, and you begin to feel better physically. Usually, morning sickness has lessened or quit completely, you may have more energy, and you may have an increase in appetite. The second trimester is also a time when the fetus is growing rapidly. At the end of the sixth month, the fetus is about 9 inches long and weighs about 1 pounds. You will likely begin to feel the baby move (quickening) between 18 and 20 weeks of the pregnancy. BODY CHANGES Your body goes through many changes during pregnancy. The changes vary from woman to woman.   Your weight will continue to increase. You will notice your lower abdomen bulging out.  You may begin to get stretch marks on your hips, abdomen, and breasts.  You may develop headaches that can be relieved by medicines approved by your health care provider.  You may urinate more often because the fetus is pressing on your bladder.  You may develop or continue to have heartburn as a result of your pregnancy.  You may develop constipation because certain hormones are causing the muscles that push waste through your intestines to slow down.  You may develop hemorrhoids or swollen, bulging veins (varicose veins).  You may have back pain because of the weight gain and pregnancy hormones relaxing your joints between the bones in your pelvis and as a result of a shift in weight and the muscles that support your  balance.  Your breasts will continue to grow and be tender.  Your gums may bleed and may be sensitive to brushing and flossing.  Dark spots or blotches (chloasma, mask of pregnancy) may develop on your face. This will likely fade after the baby is born.  A dark line from your belly button to the pubic area (linea nigra) may appear. This will likely fade after the baby is born.  You may have changes in your hair. These can include thickening of your hair, rapid growth, and changes in texture. Some women also have hair loss during or after pregnancy, or hair that feels dry or thin. Your hair will most likely return to normal after your baby is born. WHAT TO EXPECT AT YOUR PRENATAL VISITS During  a routine prenatal visit:  You will be weighed to make sure you and the fetus are growing normally.  Your blood pressure will be taken.  Your abdomen will be measured to track your baby's growth.  The fetal heartbeat will be listened to.  Any test results from the previous visit will be discussed. Your health care provider may ask you:  How you are feeling.  If you are feeling the baby move.  If you have had any abnormal symptoms, such as leaking fluid, bleeding, severe headaches, or abdominal cramping.  If you have any questions. Other tests that may be performed during your second trimester include:  Blood tests that check for:  Low iron levels (anemia).  Gestational diabetes (between 24 and 28 weeks).  Rh antibodies.  Urine tests to check for infections, diabetes, or protein in the urine.  An ultrasound to confirm the proper growth and development of the baby.  An amniocentesis to check for possible genetic problems.  Fetal screens for spina bifida and Down syndrome. HOME CARE INSTRUCTIONS   Avoid all smoking, herbs, alcohol, and unprescribed drugs. These chemicals affect the formation and growth of the baby.  Follow your health care provider's instructions regarding  medicine use. There are medicines that are either safe or unsafe to take during pregnancy.  Exercise only as directed by your health care provider. Experiencing uterine cramps is a good sign to stop exercising.  Continue to eat regular, healthy meals.  Wear a good support bra for breast tenderness.  Do not use hot tubs, steam rooms, or saunas.  Wear your seat belt at all times when driving.  Avoid raw meat, uncooked cheese, cat litter boxes, and soil used by cats. These carry germs that can cause birth defects in the baby.  Take your prenatal vitamins.  Try taking a stool softener (if your health care provider approves) if you develop constipation. Eat more high-fiber foods, such as fresh vegetables or fruit and whole grains. Drink plenty of fluids to keep your urine clear or pale yellow.  Take warm sitz baths to soothe any pain or discomfort caused by hemorrhoids. Use hemorrhoid cream if your health care provider approves.  If you develop varicose veins, wear support hose. Elevate your feet for 15 minutes, 3-4 times a day. Limit salt in your diet.  Avoid heavy lifting, wear low heel shoes, and practice good posture.  Rest with your legs elevated if you have leg cramps or low back pain.  Visit your dentist if you have not gone yet during your pregnancy. Use a soft toothbrush to brush your teeth and be gentle when you floss.  A sexual relationship may be continued unless your health care provider directs you otherwise.  Continue to go to all your prenatal visits as directed by your health care provider. SEEK MEDICAL CARE IF:   You have dizziness.  You have mild pelvic cramps, pelvic pressure, or nagging pain in the abdominal area.  You have persistent nausea, vomiting, or diarrhea.  You have a bad smelling vaginal discharge.  You have pain with urination. SEEK IMMEDIATE MEDICAL CARE IF:   You have a fever.  You are leaking fluid from your vagina.  You have spotting or  bleeding from your vagina.  You have severe abdominal cramping or pain.  You have rapid weight gain or loss.  You have shortness of breath with chest pain.  You notice sudden or extreme swelling of your face, hands, ankles, feet, or legs.  You  have not felt your baby move in over an hour.  You have severe headaches that do not go away with medicine.  You have vision changes. Document Released: 08/25/2001 Document Revised: 09/05/2013 Document Reviewed: 11/01/2012 [facility] Patient Information 2015 [place], [place]. This information is not intended to replace advice given to you by your health care provider. Make sure you discuss any questions you have with your health care provider.  PROTECT YOURSELF & YOUR BABY FROM THE FLU! Because you are pregnant, we at [facility], along with the Centers for Disease Control (CDC), recommend that you receive the flu vaccine to protect yourself and your baby from the flu. The flu is more likely to cause severe illness in pregnant women than in women of reproductive age who are not pregnant. Changes in the immune system, heart, and lungs during pregnancy make pregnant women (and women up to two weeks postpartum) more prone to severe illness from flu, including illness resulting in hospitalization. Flu also may be harmful for a pregnant woman's developing baby. A common flu symptom is fever, which may be associated with neural tube defects and other adverse outcomes for a developing baby. Getting vaccinated can also help protect a baby after birth from flu. (Mom passes antibodies onto the developing baby during her pregnancy.)  A Flu Vaccine is the Best Protection Against Flu Getting a flu vaccine is the first and most important step in protecting against flu. Pregnant women should get a flu shot and not the live attenuated influenza vaccine (LAIV), also known as nasal spray flu vaccine. Flu vaccines given during pregnancy help protect both the mother and her  baby from flu. Vaccination has been shown to reduce the risk of flu-associated acute respiratory infection in pregnant women by up to one-half. A 2018 study showed that getting a flu shot reduced a pregnant woman's risk of being hospitalized with flu by an average of 40 percent. Pregnant women who get a flu vaccine are also helping to protect their babies from flu illness for the first several months after their birth, when they are too young to get vaccinated.   A Long Record of Safety for Flu Shots in Pregnant Women Flu shots have been given to millions of pregnant women over many years with a good safety record. There is a lot of evidence that flu vaccines can be given safely during pregnancy; though these data are limited for the first trimester. The CDC recommends that pregnant women get vaccinated during any trimester of their pregnancy. It is very important for pregnant women to get the flu shot.   Other Preventive Actions In addition to getting a flu shot, pregnant women should take the same everyday preventive actions the CDC recommends of everyone, including covering coughs, washing hands often, and avoiding people who are sick.  Symptoms and Treatment If you get sick with flu symptoms call your doctor right away. There are antiviral drugs that can treat flu illness and prevent serious flu complications. The CDC recommends prompt treatment for people who have influenza infection or suspected influenza infection and who are at high risk of serious flu complications, such as people with asthma, diabetes (including gestational diabetes), or heart disease. Early treatment of influenza in hospitalized pregnant women has been shown to reduce the length of the hospital stay.  Symptoms Flu symptoms include fever, cough, sore throat, runny or stuffy nose, body aches, headache, chills and fatigue. Some people may also have vomiting and diarrhea. People may be infected with the  flu and have respiratory  symptoms without a fever.  Early Treatment is Important for Pregnant Women Treatment should begin as soon as possible because antiviral drugs work best when started early (within 48 hours after symptoms start). Antiviral drugs can make your flu illness milder and make you feel better faster. They may also prevent serious health problems that can result from flu illness. Oral oseltamivir (Tamiflu) is the preferred treatment for pregnant women because it has the most studies available to suggest that it is safe and beneficial. Antiviral drugs require a prescription from your provider. Having a fever caused by flu infection or other infections early in pregnancy may be linked to birth defects in a baby. In addition to taking antiviral drugs, pregnant women who get a fever should treat their fever with Tylenol (acetaminophen) and contact their provider immediately.  When to Seek Emergency Medical Care If you are pregnant and have any of these signs, seek care immediately:  Difficulty breathing or shortness of breath  Pain or pressure in the chest or abdomen  Sudden dizziness  Confusion  Severe or persistent vomiting  High fever that is not responding to Tylenol (or store brand equivalent)  Decreased or no movement of your baby  [email or web address]pt.htm

## 2019-08-14 NOTE — Progress Notes (Signed)
[place] DI/DI twins,26+3 wks,cx 4.1 cm,normal ovaries bilat BABY A: female,breech inferior,anterior placenta gr 0,svp of fluid 4.9 cm,fhr 152 bpm,efw 1054 g 73% BABY B:female,superior transverse head right,posterior placenta gr 0,svp of fluid 4.6 cm.fhr 148 bpm,EFW 1036 69%,discordance 1.6%

## 2019-08-14 NOTE — Progress Notes (Signed)
   HIGH-RISK PREGNANCY VISIT Patient name: Rachel Roy MRN [id number]  Date of birth: 1985-12-11 Chief Complaint:   Routine Prenatal Visit  History of Present Illness:   Rachel Roy is a 33 y.o. G21P0000 female at [redacted]w[redacted]d with an Estimated Date of Delivery: 11/17/19 being seen today for ongoing management of a high-risk pregnancy complicated by multiple gestation di-di twins.  Today she reports no complaints. Contractions: Not present. Vag. Bleeding: None.  Movement: Present. denies leaking of fluid.  Review of Systems:   Pertinent items are noted in HPI Denies abnormal vaginal discharge w/ itching/odor/irritation, headaches, visual changes, shortness of breath, chest pain, abdominal pain, severe nausea/vomiting, or problems with urination or bowel movements unless otherwise stated above. Pertinent History Reviewed:  Reviewed past medical,surgical, social, obstetrical and family history.  Reviewed problem list, medications and allergies. Physical Assessment:   Vitals:   08/14/19 1538  BP: 123/81  Pulse: 87  Weight: 242 lb (109.8 kg)  Body mass index is 38.82 kg/m.           Physical Examination:   General appearance: alert, well appearing, and in no distress  Mental status: alert, oriented to person, place, and time  Skin: warm & dry   Extremities: Edema: None    Cardiovascular: normal heart rate noted  Respiratory: normal respiratory effort, no distress  Abdomen: gravid, soft, non-tender  Pelvic: Cervical exam deferred         Fetal Status: Fetal Heart Rate (bpm): 152/148 u/s   Movement: Present    [place] DI/DI twins,26+3 wks,cx 4.1 cm,normal ovaries bilat BABY A: female,breech inferior,anterior placenta gr 0,svp of fluid 4.9 cm,fhr 152 bpm,efw 1054 g 73% BABY B:female,superior transverse head right,posterior placenta gr 0,svp of fluid 4.6 cm.fhr 148 bpm,EFW 1036 69%,discordance 1.6%  Fetal Surveillance Testing today: u/s   Chaperone: n/a    Results for orders placed or  performed in visit on 08/14/19 (from the past 24 hour(s))  POC Urinalysis Dipstick OB   Collection Time: 08/14/19  3:41 PM  Result Value Ref Range   Color, UA     Clarity, UA     Glucose, UA Negative Negative   Bilirubin, UA     Ketones, UA neg    Spec Grav, UA     Blood, UA neg    pH, UA     POC,PROTEIN,UA Negative Negative, Trace, Small (1+), Moderate (2+), Large (3+), 4+   Urobilinogen, UA     Nitrite, UA neg    Leukocytes, UA Negative Negative   Appearance     Odor      Assessment & Plan:  1) High-risk pregnancy G1P0000 at [redacted]w[redacted]d with an Estimated Date of Delivery: 11/17/19   2) Di-Di Twins, stable, discordance 1.6%  Meds: No orders of the defined types were placed in this encounter.  Labs/procedures today: u/s  Treatment Plan:  U/S q4wks    2x/wk testing @ 36wks or weekly BPP    Deliver @ 38wks:____   Reviewed: Preterm labor symptoms and general obstetric precautions including but not limited to vaginal bleeding, contractions, leaking of fluid and fetal movement were reviewed in detail with the patient.  All questions were answered.   Follow-up: Return for 1st available PN2 (no visit), then 4wks for HROB and twin efw u/s in person w/ , MD or CNM.  Orders Placed This Encounter  Procedures  . POC Urinalysis Dipstick OB   Roma Schanz CNM, [facility] 08/14/2019 4:53 PM

## 2019-08-16 ENCOUNTER — Other Ambulatory Visit: Payer: Self-pay

## 2019-08-16 ENCOUNTER — Other Ambulatory Visit: Payer: PRIVATE HEALTH INSURANCE

## 2019-08-16 DIAGNOSIS — O0990 Supervision of high risk pregnancy, unspecified, unspecified trimester: Secondary | ICD-10-CM

## 2019-08-16 DIAGNOSIS — Z131 Encounter for screening for diabetes mellitus: Secondary | ICD-10-CM

## 2019-08-16 DIAGNOSIS — Z3A26 26 weeks gestation of pregnancy: Secondary | ICD-10-CM

## 2019-08-17 LAB — GLUCOSE TOLERANCE, 2 HOURS W/ 1HR
Glucose, 1 hour: 179 mg/dL (ref 65–179)
Glucose, 2 hour: 113 mg/dL (ref 65–152)
Glucose, Fasting: 83 mg/dL (ref 65–91)

## 2019-08-17 LAB — RPR: RPR Ser Ql: NONREACTIVE

## 2019-08-17 LAB — CBC
Hematocrit: 33 % — ABNORMAL LOW (ref 34.0–46.6)
Hemoglobin: 10.6 g/dL — ABNORMAL LOW (ref 11.1–15.9)
MCH: 26.4 pg — ABNORMAL LOW (ref 26.6–33.0)
MCHC: 32.1 g/dL (ref 31.5–35.7)
MCV: 82 fL (ref 79–97)
Platelets: 279 10*3/uL (ref 150–450)
RBC: 4.02 x10E6/uL (ref 3.77–5.28)
RDW: 13.4 % (ref 11.7–15.4)
WBC: 11.3 10*3/uL — ABNORMAL HIGH (ref 3.4–10.8)

## 2019-08-17 LAB — HIV ANTIBODY (ROUTINE TESTING W REFLEX): HIV Screen 4th Generation wRfx: NONREACTIVE

## 2019-08-17 LAB — ANTIBODY SCREEN: Antibody Screen: NEGATIVE

## 2019-09-11 ENCOUNTER — Other Ambulatory Visit: Payer: Self-pay | Admitting: Women's Health

## 2019-09-11 DIAGNOSIS — O30002 Twin pregnancy, unspecified number of placenta and unspecified number of amniotic sacs, second trimester: Secondary | ICD-10-CM

## 2019-09-12 ENCOUNTER — Ambulatory Visit (INDEPENDENT_AMBULATORY_CARE_PROVIDER_SITE_OTHER): Payer: PRIVATE HEALTH INSURANCE | Admitting: Obstetrics & Gynecology

## 2019-09-12 ENCOUNTER — Ambulatory Visit (INDEPENDENT_AMBULATORY_CARE_PROVIDER_SITE_OTHER): Payer: PRIVATE HEALTH INSURANCE

## 2019-09-12 ENCOUNTER — Other Ambulatory Visit: Payer: Self-pay

## 2019-09-12 ENCOUNTER — Encounter: Payer: Self-pay | Admitting: Obstetrics & Gynecology

## 2019-09-12 VITALS — BP 136/91 | Wt 251.0 lb

## 2019-09-12 DIAGNOSIS — Z1389 Encounter for screening for other disorder: Secondary | ICD-10-CM

## 2019-09-12 DIAGNOSIS — O0990 Supervision of high risk pregnancy, unspecified, unspecified trimester: Secondary | ICD-10-CM

## 2019-09-12 DIAGNOSIS — O30042 Twin pregnancy, dichorionic/diamniotic, second trimester: Secondary | ICD-10-CM

## 2019-09-12 DIAGNOSIS — Z362 Encounter for other antenatal screening follow-up: Secondary | ICD-10-CM | POA: Diagnosis not present

## 2019-09-12 DIAGNOSIS — O30002 Twin pregnancy, unspecified number of placenta and unspecified number of amniotic sacs, second trimester: Secondary | ICD-10-CM

## 2019-09-12 DIAGNOSIS — Z3A30 30 weeks gestation of pregnancy: Secondary | ICD-10-CM

## 2019-09-12 DIAGNOSIS — Z331 Pregnant state, incidental: Secondary | ICD-10-CM

## 2019-09-12 DIAGNOSIS — O0992 Supervision of high risk pregnancy, unspecified, second trimester: Secondary | ICD-10-CM

## 2019-09-12 LAB — POCT URINALYSIS DIPSTICK OB
Blood, UA: NEGATIVE
Glucose, UA: NEGATIVE
Ketones, UA: NEGATIVE
Nitrite, UA: NEGATIVE
POC,PROTEIN,UA: NEGATIVE

## 2019-09-12 NOTE — Progress Notes (Signed)
[place] 30+4 wks,DI/DI twins,cx 3.7 cm BABY A: cephalic left,female,anterior placenta gr 1,fhr 140 bpm,svp of fluid 4.7 cm,EFW 1772 g 68%,discordance 1.2% BABY B: cephalic right superior,female,posterior placenta gr 1,fhr 171 bpm,svp of fluid 5.2 cm,EFW 1962 G 91%

## 2019-09-12 NOTE — Progress Notes (Signed)
   HIGH-RISK PREGNANCY VISIT Patient name: Rachel Roy MRN [id number]  Date of birth: November 17, 1985 Chief Complaint:   Routine Prenatal Visit  History of Present Illness:   Rachel Roy is a 33 y.o. G1P0000 female at [redacted]w[redacted]d with an Estimated Date of Delivery: 11/17/19 being seen today for ongoing management of a high-risk pregnancy complicated by DiDi twins.  Today she reports no complaints. Contractions: Irregular. Vag. Bleeding: None.  Movement: Present. denies leaking of fluid.  Review of Systems:   Pertinent items are noted in HPI Denies abnormal vaginal discharge w/ itching/odor/irritation, headaches, visual changes, shortness of breath, chest pain, abdominal pain, severe nausea/vomiting, or problems with urination or bowel movements unless otherwise stated above. Pertinent History Reviewed:  Reviewed past medical,surgical, social, obstetrical and family history.  Reviewed problem list, medications and allergies. Physical Assessment:   Vitals:   09/12/19 1001  BP: (!) 136/91  Weight: 251 lb (113.9 kg)  Body mass index is 40.27 kg/m.           Physical Examination:   General appearance: alert, well appearing, and in no distress  Mental status: alert, oriented to person, place, and time  Skin: warm & dry   Extremities:      Cardiovascular: normal heart rate noted  Respiratory: normal respiratory effort, no distress  Abdomen: gravid, soft, non-tender  Pelvic: Cervical exam deferred         Fetal Status:     Movement: Present    Fetal Surveillance Testing today: sonogram normal   Chaperone: n/a    Results for orders placed or performed in visit on 09/12/19 (from the past 24 hour(s))  POC Urinalysis Dipstick OB   Collection Time: 09/12/19 10:03 AM  Result Value Ref Range   Color, UA     Clarity, UA     Glucose, UA Negative Negative   Bilirubin, UA     Ketones, UA n    Spec Grav, UA     Blood, UA n    pH, UA     POC,PROTEIN,UA Negative Negative, Trace, Small (1+),  Moderate (2+), Large (3+), 4+   Urobilinogen, UA     Nitrite, UA n    Leukocytes, UA Moderate (2+) (A) Negative   Appearance     Odor      Assessment & Plan:  1) High-risk pregnancy G1P0000 at [redacted]w[redacted]d with an Estimated Date of Delivery: 11/17/19   2) DiDi twins excellent growth of both, stable, watching BP to take at home 2-4 times per day    Meds: No orders of the defined types were placed in this encounter.   Labs/procedures today: sonogram  Treatment Plan:  EFW 34 weeks  Reviewed: Preterm labor symptoms and general obstetric precautions including but not limited to vaginal bleeding, contractions, leaking of fluid and fetal movement were reviewed in detail with the patient.  All questions were answered.  home bp cuff. Rx faxed to . Check bp weekly, let us know if >140/90.   Follow-up: No follow-ups on file.  Orders Placed This Encounter  Procedures  . POC Urinalysis Dipstick OB   Mertie Clause Shivank Pinedo  09/12/2019 10:40 AM

## 2019-09-15 NOTE — L&D Delivery Note (Signed)
Operative Delivery Note   Rachel, Roy [[id number]]   Indication for operative vaginal delivery: Failure of descent dueing IOL for severe preeclampsia at [redacted]w[redacted]d  Patient was examined and found to be fully dilated with fetal station of +2.  Patient's bladder was noted to be empty, and there were no known fetal contraindications to operative vaginal delivery. EFW was about 6 lbs by recent ultrasound.  FHR tracing was Category I x 2. Epidural anesthesia in place.  Risks of vacuum assistance were discussed in detail, including but not limited to, bleeding, infection, damage to maternal tissues, fetal cephalohematoma, inability to effect vaginal delivery of the head or shoulder dystocia that cannot be resolved by established maneuvers and need for emergency cesarean section.  Patient gave verbal consent.  The soft vacuum cup was positioned over the sagittal suture 3 cm anterior to posterior fontanelle.  Pressure was then increased to 500 mmHg, and the patient was instructed to push.  Pulling was administered along the pelvic curve while patient was pushing; there were 2 sets contractions and no popoffs.  Vacuum was reduced in between contractions.  The infant was then delivered atraumatically, noted to be a viable female infant, Apgars of 5 and 7.  Neonatology present for delivery.  2nd degree perineal laceration noted.   Sponge, instrument and needle counts were correct x 2.  EBL 500 ml.     APGAR: 5, 7; weight 5 lb 10.7 oz (2570 g).   Placenta status: still in uterus as Twin B still undelivered  Cord pH: sent but pending  Baby A  to NICU for observation     Rachel, Roy [[id number]]   Delivered via cesarean section for failure of cervical dilation and descent, please refer to preoperative and operative notes for more details.    Jaynie Collins, MD, FACOG Obstetrician & Gynecologist, [facility] for [employer], [facility]

## 2019-09-26 ENCOUNTER — Encounter: Payer: Self-pay | Admitting: Obstetrics & Gynecology

## 2019-09-26 ENCOUNTER — Other Ambulatory Visit: Payer: Self-pay

## 2019-09-26 ENCOUNTER — Ambulatory Visit (HOSPITAL_COMMUNITY): Payer: PRIVATE HEALTH INSURANCE | Admitting: Psychiatry

## 2019-09-26 ENCOUNTER — Ambulatory Visit (INDEPENDENT_AMBULATORY_CARE_PROVIDER_SITE_OTHER): Payer: PRIVATE HEALTH INSURANCE | Admitting: Obstetrics & Gynecology

## 2019-09-26 VITALS — BP 145/95 | HR 87 | Wt 253.4 lb

## 2019-09-26 DIAGNOSIS — O0993 Supervision of high risk pregnancy, unspecified, third trimester: Secondary | ICD-10-CM

## 2019-09-26 DIAGNOSIS — O0990 Supervision of high risk pregnancy, unspecified, unspecified trimester: Secondary | ICD-10-CM

## 2019-09-26 DIAGNOSIS — Z331 Pregnant state, incidental: Secondary | ICD-10-CM

## 2019-09-26 DIAGNOSIS — O30042 Twin pregnancy, dichorionic/diamniotic, second trimester: Secondary | ICD-10-CM

## 2019-09-26 DIAGNOSIS — Z1389 Encounter for screening for other disorder: Secondary | ICD-10-CM

## 2019-09-26 DIAGNOSIS — Z3A32 32 weeks gestation of pregnancy: Secondary | ICD-10-CM

## 2019-09-26 DIAGNOSIS — O133 Gestational [pregnancy-induced] hypertension without significant proteinuria, third trimester: Secondary | ICD-10-CM

## 2019-09-26 DIAGNOSIS — O30043 Twin pregnancy, dichorionic/diamniotic, third trimester: Secondary | ICD-10-CM

## 2019-09-26 LAB — POCT URINALYSIS DIPSTICK OB
Blood, UA: NEGATIVE
Glucose, UA: NEGATIVE
Ketones, UA: NEGATIVE
Nitrite, UA: NEGATIVE
POC,PROTEIN,UA: NEGATIVE

## 2019-09-26 NOTE — Progress Notes (Signed)
   HIGH-RISK PREGNANCY VISIT Patient name: Rachel Roy MRN [id number]  Date of birth: 1986-02-17 Chief Complaint:   High Risk Gestation  History of Present Illness:   Rachel Roy is a 34 y.o. G1P0000 female at 51w4dwith an Estimated Date of Delivery: 11/17/19 being seen today for ongoing management of a high-risk pregnancy complicated by DiDi twins and now Gestational Hypertension.  Today she reports no complaints. Contractions: Not present.  .  Movement: Present. denies leaking of fluid.  Review of Systems:   Pertinent items are noted in HPI Denies abnormal vaginal discharge w/ itching/odor/irritation, headaches, visual changes, shortness of breath, chest pain, abdominal pain, severe nausea/vomiting, or problems with urination or bowel movements unless otherwise stated above. Pertinent History Reviewed:  Reviewed past medical,surgical, social, obstetrical and family history.  Reviewed problem list, medications and allergies. Physical Assessment:   Vitals:   09/26/19 0900  BP: (!) 145/95  Pulse: 87  Weight: 253 lb 6.4 oz (114.9 kg)  Body mass index is 40.65 kg/m.               Physical Examination:   General appearance: alert, well appearing, and in no distress  Mental status: alert, oriented to person, place, and time  Skin: warm & dry   Extremities: Edema: Trace    Cardiovascular: normal heart rate noted  Respiratory: normal respiratory effort, no distress  Abdomen: gravid, soft, non-tender  Pelvic: Cervical exam deferred         Fetal Status:     Movement: Present    Fetal Surveillance Testing today: FHR 145/140   Chaperone: n/a    Results for orders placed or performed in visit on 09/26/19 (from the past 24 hour(s))  POC Urinalysis Dipstick OB   Collection Time: 09/26/19  9:48 AM  Result Value Ref Range   Color, UA     Clarity, UA     Glucose, UA Negative Negative   Bilirubin, UA     Ketones, UA neg    Spec Grav, UA     Blood, UA neg    pH, UA     POC,PROTEIN,UA Negative Negative, Trace, Small (1+), Moderate (2+), Large (3+), 4+   Urobilinogen, UA     Nitrite, UA neg    Leukocytes, UA Small (1+) (A) Negative   Appearance     Odor      Assessment & Plan:  1) High-risk pregnancy G1P0000 at 39w4dith an Estimated Date of Delivery: 11/17/19   2) DiDi twins, stable  3) Gestational hypertension, newly diagnosed, labs today, begin twice weekly testing  Meds: No orders of the defined types were placed in this encounter.   Labs/procedures today: pending  Treatment Plan:  Twice weekly surveillance, plan delivery timing around [redacted] weeks gestation or as clinically indicated  Reviewed: Preterm labor symptoms and general obstetric precautions including but not limited to vaginal bleeding, contractions, leaking of fluid and fetal movement were reviewed in detail with the patient.  All questions were answered. Has home bp cuff.   Follow-up: Return in about 3 days (around 09/29/2019) for NST, HROB.  Orders Placed This Encounter  Procedures  . CBC  . Comp Met (CMET)  . POC Urinalysis Dipstick OB   LuMertie Clauseure  09/26/2019 10:08 AM

## 2019-09-27 ENCOUNTER — Other Ambulatory Visit: Payer: Self-pay | Admitting: Obstetrics & Gynecology

## 2019-09-27 DIAGNOSIS — O30009 Twin pregnancy, unspecified number of placenta and unspecified number of amniotic sacs, unspecified trimester: Secondary | ICD-10-CM

## 2019-09-27 DIAGNOSIS — O10919 Unspecified pre-existing hypertension complicating pregnancy, unspecified trimester: Secondary | ICD-10-CM

## 2019-09-27 LAB — CBC
Hematocrit: 34.8 % (ref 34.0–46.6)
Hemoglobin: 11 g/dL — ABNORMAL LOW (ref 11.1–15.9)
MCH: 25.1 pg — ABNORMAL LOW (ref 26.6–33.0)
MCHC: 31.6 g/dL (ref 31.5–35.7)
MCV: 80 fL (ref 79–97)
Platelets: 209 10*3/uL (ref 150–450)
RBC: 4.38 x10E6/uL (ref 3.77–5.28)
RDW: 14.3 % (ref 11.7–15.4)
WBC: 8.2 10*3/uL (ref 3.4–10.8)

## 2019-09-27 LAB — COMPREHENSIVE METABOLIC PANEL
ALT: 9 IU/L (ref 0–32)
AST: 17 IU/L (ref 0–40)
Albumin/Globulin Ratio: 1.2 (ref 1.2–2.2)
Albumin: 3.1 g/dL — ABNORMAL LOW (ref 3.8–4.8)
Alkaline Phosphatase: 161 IU/L — ABNORMAL HIGH (ref 39–117)
BUN/Creatinine Ratio: 7 — ABNORMAL LOW (ref 9–23)
BUN: 5 mg/dL — ABNORMAL LOW (ref 6–20)
Bilirubin Total: <0.2 mg/dL (ref 0.0–1.2)
CO2: 17 mmol/L — ABNORMAL LOW (ref 20–29)
Calcium: 9.6 mg/dL (ref 8.7–10.2)
Chloride: 108 mmol/L — ABNORMAL HIGH (ref 96–106)
Creatinine, Ser: 0.75 mg/dL (ref 0.57–1.00)
GFR calc Af Amer: 121 mL/min/{1.73_m2} (ref >59)
GFR calc non Af Amer: 105 mL/min/{1.73_m2} (ref >59)
Globulin, Total: 2.6 g/dL (ref 1.5–4.5)
Glucose: 69 mg/dL (ref 65–99)
Potassium: 4.3 mmol/L (ref 3.5–5.2)
Sodium: 141 mmol/L (ref 134–144)
Total Protein: 5.7 g/dL — ABNORMAL LOW (ref 6.0–8.5)

## 2019-09-28 ENCOUNTER — Other Ambulatory Visit: Payer: Self-pay

## 2019-09-28 ENCOUNTER — Ambulatory Visit (INDEPENDENT_AMBULATORY_CARE_PROVIDER_SITE_OTHER): Payer: PRIVATE HEALTH INSURANCE | Admitting: Obstetrics and Gynecology

## 2019-09-28 ENCOUNTER — Ambulatory Visit (INDEPENDENT_AMBULATORY_CARE_PROVIDER_SITE_OTHER): Payer: PRIVATE HEALTH INSURANCE

## 2019-09-28 VITALS — BP 145/96 | HR 79 | Wt 251.0 lb

## 2019-09-28 DIAGNOSIS — O30009 Twin pregnancy, unspecified number of placenta and unspecified number of amniotic sacs, unspecified trimester: Secondary | ICD-10-CM

## 2019-09-28 DIAGNOSIS — Z1389 Encounter for screening for other disorder: Secondary | ICD-10-CM

## 2019-09-28 DIAGNOSIS — Z331 Pregnant state, incidental: Secondary | ICD-10-CM

## 2019-09-28 DIAGNOSIS — Z3A32 32 weeks gestation of pregnancy: Secondary | ICD-10-CM | POA: Diagnosis not present

## 2019-09-28 DIAGNOSIS — O10913 Unspecified pre-existing hypertension complicating pregnancy, third trimester: Secondary | ICD-10-CM

## 2019-09-28 DIAGNOSIS — O0990 Supervision of high risk pregnancy, unspecified, unspecified trimester: Secondary | ICD-10-CM

## 2019-09-28 DIAGNOSIS — O30043 Twin pregnancy, dichorionic/diamniotic, third trimester: Secondary | ICD-10-CM

## 2019-09-28 DIAGNOSIS — O30003 Twin pregnancy, unspecified number of placenta and unspecified number of amniotic sacs, third trimester: Secondary | ICD-10-CM

## 2019-09-28 DIAGNOSIS — O10919 Unspecified pre-existing hypertension complicating pregnancy, unspecified trimester: Secondary | ICD-10-CM

## 2019-09-28 LAB — POCT URINALYSIS DIPSTICK OB
Blood, UA: NEGATIVE
Glucose, UA: NEGATIVE
Ketones, UA: NEGATIVE
Leukocytes, UA: NEGATIVE
Nitrite, UA: NEGATIVE
POC,PROTEIN,UA: NEGATIVE

## 2019-09-28 NOTE — Progress Notes (Signed)
Patient ID: Rachel Roy, female   DOB: [birth year], 34 y.o.   MRN: [id number]    [facility] PREGNANCY VISIT Patient name: Rachel Roy MRN [id number]  Date of birth: 01/22/1986 Chief Complaint:   Routine Prenatal Visit ([place])  History of Present Illness:   Rachel Roy is a 34 y.o. G1P0000 female at [redacted]w[redacted]d with an Estimated Date of Delivery: 11/17/19 being seen today for ongoing management of a high-risk pregnancy complicated by DiDi twins and GHTN. BP's at home checked 2-4x/day Today she reports that she has a raised white-appearing area on her lower abdomen that causes her some pain., area of peau d'orange developing. The pt has not received Bethamethasone shot. She checks her BPs 2-4x daily and her systolic readings have been in the 140-150s.  Contractions: Not present. Vag. Bleeding: None.  Movement: Present. denies leaking of fluid.  Review of Systems:   Pertinent items are noted in HPI Denies abnormal vaginal discharge w/ itching/odor/irritation, headaches, visual changes, shortness of breath, chest pain, abdominal pain, severe nausea/vomiting, or problems with urination or bowel movements unless otherwise stated above. Pertinent History Reviewed:  Reviewed past medical,surgical, social, obstetrical and family history.  Reviewed problem list, medications and allergies. Physical Assessment:   Vitals:   09/28/19 0944  BP: (!) 145/96  Pulse: 79  Weight: 251 lb (113.9 kg)  Body mass index is 40.27 kg/m.           Physical Examination:   General appearance: alert, well appearing, and in no distress and oriented to person, place, and time  Mental status: alert, oriented to person, place, and time, normal mood, behavior, speech, dress, motor activity, and thought processes, affect appropriate to mood  Skin: warm & dry   Extremities: Edema: Trace    Cardiovascular: normal heart rate noted  Respiratory: normal respiratory effort, no distress  Abdomen: gravid, soft,  non-tender  Pelvic: Cervical exam deferred         Fetal Status: Fetal Heart Rate (bpm): 161/169 u/s Fundal Height: 41 cm Movement: Present    Fetal Surveillance Testing today: [place] 32+6 wks,DI/DI TWINS BABY A: cephalic left,anterior placenta gr 2,fhr 161 bpm,BPP 8/8,SVP of fluid 4.4 cm,RI .53,.52,.48=8.6% S/D 2.0=12.7% BABY B :cephalic right,posterior placenta gr 2,FHR 169 bpm,BPP 8/8,SVP of fluid 4.7 cm,RI .47,.58,.48,.57=21% S/D 2.2=21%  Results for orders placed or performed in visit on 09/28/19 (from the past 24 hour(s))  POC Urinalysis Dipstick OB   Collection Time: 09/28/19  9:48 AM  Result Value Ref Range   Color, UA     Clarity, UA     Glucose, UA Negative Negative   Bilirubin, UA     Ketones, UA neg    Spec Grav, UA     Blood, UA neg    pH, UA     POC,PROTEIN,UA Negative Negative, Trace, Small (1+), Moderate (2+), Large (3+), 4+   Urobilinogen, UA     Nitrite, UA neg    Leukocytes, UA Negative Negative   Appearance     Odor      Assessment & Plan:  1) High-risk pregnancy G1P0000 at [redacted]w[redacted]d with an Estimated Date of Delivery: 11/17/19   2) DiDi Twins, stable specifically reassuring fetal assessment with good BPP, fluid and excellent Doppler flow   3) GHTN, stable  4) Did not receive Betamethasone  Meds: No orders of the defined types were placed in this encounter.  Labs/procedures today: none  Treatment Plan:  2x/wk surveillance, plan delivery around 36 wks or as clinically indicated  Reviewed: Preterm labor  symptoms and general obstetric precautions including but not limited to vaginal bleeding, contractions, leaking of fluid  estions were answered. Has home bp cuff. Check bp weekly, let us know if >140/90.   Follow-up: Return in about 5 days (around 10/03/2019) for NST.  By signing my name below, I, De Burrs, attest that this documentation has been prepared under the direction and in the presence of Jonnie Kind, MD. Electronically Signed: De Burrs,  Medical Scribe. 09/28/19. 10:10 AM.  I personally performed the services described in this documentation, which was SCRIBED in my presence. The recorded information has been reviewed and considered accurate. It has been edited as necessary during review. Jonnie Kind, MD

## 2019-09-28 NOTE — Progress Notes (Signed)
[place] 32+6 wks,DI/DI TWINS BABY A: cephalic left,anterior placenta gr 2,fhr 161 bpm,BPP 8/8,SVP of fluid 4.4 cm,RI .53,.52,.48=8.6% S/D 2.0=12.7% BABY B :cephalic right,posterior placenta gr 2,FHR 169 bpm,BPP 8/8,SVP of fluid 4.7 cm,RI .47,.58,.48,.57=21% S/D 2.2=21%

## 2019-09-29 ENCOUNTER — Other Ambulatory Visit: Payer: PRIVATE HEALTH INSURANCE | Admitting: Obstetrics and Gynecology

## 2019-10-02 ENCOUNTER — Encounter: Payer: Self-pay | Admitting: Obstetrics & Gynecology

## 2019-10-02 ENCOUNTER — Other Ambulatory Visit: Payer: Self-pay

## 2019-10-02 ENCOUNTER — Ambulatory Visit (INDEPENDENT_AMBULATORY_CARE_PROVIDER_SITE_OTHER): Payer: PRIVATE HEALTH INSURANCE | Admitting: Obstetrics & Gynecology

## 2019-10-02 VITALS — BP 122/85 | HR 81 | Wt 254.5 lb

## 2019-10-02 DIAGNOSIS — O30043 Twin pregnancy, dichorionic/diamniotic, third trimester: Secondary | ICD-10-CM | POA: Diagnosis not present

## 2019-10-02 DIAGNOSIS — O0993 Supervision of high risk pregnancy, unspecified, third trimester: Secondary | ICD-10-CM

## 2019-10-02 DIAGNOSIS — O133 Gestational [pregnancy-induced] hypertension without significant proteinuria, third trimester: Secondary | ICD-10-CM | POA: Diagnosis not present

## 2019-10-02 DIAGNOSIS — O0990 Supervision of high risk pregnancy, unspecified, unspecified trimester: Secondary | ICD-10-CM

## 2019-10-02 DIAGNOSIS — Z3A33 33 weeks gestation of pregnancy: Secondary | ICD-10-CM

## 2019-10-02 DIAGNOSIS — Z331 Pregnant state, incidental: Secondary | ICD-10-CM

## 2019-10-02 DIAGNOSIS — Z1389 Encounter for screening for other disorder: Secondary | ICD-10-CM

## 2019-10-02 LAB — POCT URINALYSIS DIPSTICK OB
Blood, UA: NEGATIVE
Glucose, UA: NEGATIVE
Ketones, UA: NEGATIVE
Nitrite, UA: NEGATIVE
POC,PROTEIN,UA: NEGATIVE

## 2019-10-02 NOTE — Progress Notes (Signed)
HIGH-RISK PREGNANCY VISIT Patient name: Rachel Roy MRN [id number]  Date of birth: 1986-04-22 Chief Complaint:   High Risk Gestation (NST today)  History of Present Illness:   Rachel Roy is a 34 y.o. G1P0000 female at [redacted]w[redacted]d with an Estimated Date of Delivery: 11/17/19 being seen today for ongoing management of a high-risk pregnancy complicated by DiDi twins nd Gestational hypertension.  Today she reports no complaints. Contractions: Not present. Vag. Bleeding: None.  Movement: Present. denies leaking of fluid.  Review of Systems:   Pertinent items are noted in HPI Denies abnormal vaginal discharge w/ itching/odor/irritation, headaches, visual changes, shortness of breath, chest pain, abdominal pain, severe nausea/vomiting, or problems with urination or bowel movements unless otherwise stated above. Pertinent History Reviewed:  Reviewed past medical,surgical, social, obstetrical and family history.  Reviewed problem list, medications and allergies. Physical Assessment:   Vitals:   10/02/19 0908  BP: 122/85  Pulse: 81  Weight: 254 lb 8 oz (115.4 kg)  Body mass index is 40.83 kg/m.           Physical Examination:   General appearance: alert, well appearing, and in no distress  Mental status: alert, oriented to person, place, and time  Skin: warm & dry   Extremities: Edema: Trace    Cardiovascular: normal heart rate noted  Respiratory: normal respiratory effort, no distress  Abdomen: gravid, soft, non-tender  Pelvic: Cervical exam deferred         Fetal Status: Fetal Heart Rate (bpm): 150/155   Movement: Present    Fetal Surveillance Testing today: Rective NST x 2   Chaperone: n/a    Results for orders placed or performed in visit on 10/02/19 (from the past 24 hour(s))  POC Urinalysis Dipstick OB   Collection Time: 10/02/19  9:09 AM  Result Value Ref Range   Color, UA     Clarity, UA     Glucose, UA Negative Negative   Bilirubin, UA     Ketones, UA neg    Spec  Grav, UA     Blood, UA neg    pH, UA     POC,PROTEIN,UA Negative Negative, Trace, Small (1+), Moderate (2+), Large (3+), 4+   Urobilinogen, UA     Nitrite, UA neg    Leukocytes, UA Moderate (2+) (A) Negative   Appearance     Odor      Assessment & Plan:  1) High-risk pregnancy G1P0000 at [redacted]w[redacted]d with an Estimated Date of Delivery: 11/17/19   2) DiDi twins, stable  3) Gestational Hypertension, stable, no meds, BP at home are borderline for the need for meds  Meds: No orders of the defined types were placed in this encounter.   Labs/procedures today: NST x 2  Rachel Roy is at [redacted]w[redacted]d Estimated Date of Delivery: 11/17/19  NST being performed due to gestational hypertension in twins  Today the NST for both twins  is Reactive  Fetal Monitoring:  Baseline: 150 bpm, Variability: Good {> 6 bpm), Accelerations: Reactive and Decelerations: Absent   Reactive for both twins  The accelerations are >15 bpm and more than 2 in 20 minutes  Final diagnosis:  Reactive NST  Florian Buff, MD     Treatment Plan:  Twice weekly surveillance, sonogram alternating with NST, induction at 36 weeks or as clinically indicated   Reviewed: Preterm labor symptoms and general obstetric precautions including but not limited to vaginal bleeding, contractions, leaking of fluid and fetal movement were reviewed in detail with the patient.  All questions were answered. Has home bp cuff. Rx faxed to . Check bp weekly, let us know if >140/90.   Follow-up: Return in about 3 days (around 10/05/2019) for BPP/sono, HROB.  Orders Placed This Encounter  Procedures  . POC Urinalysis Dipstick OB   Rachel Roy  10/02/2019 9:54 AM

## 2019-10-03 ENCOUNTER — Other Ambulatory Visit: Payer: Self-pay | Admitting: Obstetrics & Gynecology

## 2019-10-03 DIAGNOSIS — O30043 Twin pregnancy, dichorionic/diamniotic, third trimester: Secondary | ICD-10-CM

## 2019-10-03 DIAGNOSIS — O10919 Unspecified pre-existing hypertension complicating pregnancy, unspecified trimester: Secondary | ICD-10-CM

## 2019-10-05 ENCOUNTER — Encounter: Payer: Self-pay | Admitting: Advanced Practice Midwife

## 2019-10-05 ENCOUNTER — Other Ambulatory Visit: Payer: Self-pay

## 2019-10-05 ENCOUNTER — Ambulatory Visit (INDEPENDENT_AMBULATORY_CARE_PROVIDER_SITE_OTHER): Payer: PRIVATE HEALTH INSURANCE

## 2019-10-05 ENCOUNTER — Ambulatory Visit (INDEPENDENT_AMBULATORY_CARE_PROVIDER_SITE_OTHER): Payer: PRIVATE HEALTH INSURANCE | Admitting: Advanced Practice Midwife

## 2019-10-05 ENCOUNTER — Ambulatory Visit: Payer: PRIVATE HEALTH INSURANCE | Admitting: Psychiatry

## 2019-10-05 VITALS — BP 139/98 | HR 84 | Wt 255.0 lb

## 2019-10-05 DIAGNOSIS — O10919 Unspecified pre-existing hypertension complicating pregnancy, unspecified trimester: Secondary | ICD-10-CM

## 2019-10-05 DIAGNOSIS — Z331 Pregnant state, incidental: Secondary | ICD-10-CM

## 2019-10-05 DIAGNOSIS — Z23 Encounter for immunization: Secondary | ICD-10-CM

## 2019-10-05 DIAGNOSIS — O10913 Unspecified pre-existing hypertension complicating pregnancy, third trimester: Secondary | ICD-10-CM

## 2019-10-05 DIAGNOSIS — O0990 Supervision of high risk pregnancy, unspecified, unspecified trimester: Secondary | ICD-10-CM

## 2019-10-05 DIAGNOSIS — Z3A33 33 weeks gestation of pregnancy: Secondary | ICD-10-CM

## 2019-10-05 DIAGNOSIS — Z1389 Encounter for screening for other disorder: Secondary | ICD-10-CM

## 2019-10-05 DIAGNOSIS — O0993 Supervision of high risk pregnancy, unspecified, third trimester: Secondary | ICD-10-CM

## 2019-10-05 DIAGNOSIS — O30043 Twin pregnancy, dichorionic/diamniotic, third trimester: Secondary | ICD-10-CM

## 2019-10-05 LAB — POCT URINALYSIS DIPSTICK OB
Blood, UA: NEGATIVE
Glucose, UA: NEGATIVE
Ketones, UA: NEGATIVE
Leukocytes, UA: NEGATIVE
Nitrite, UA: NEGATIVE
POC,PROTEIN,UA: NEGATIVE

## 2019-10-05 NOTE — Patient Instructions (Signed)

## 2019-10-05 NOTE — Progress Notes (Signed)
   PRENATAL VISIT NOTE  Subjective:  Rachel Roy is a 34 y.o. G1P0000 at [redacted]w[redacted]d being seen today for ongoing prenatal care.  She is currently monitored for the following issues for this high-risk pregnancy and has Anxiety, generalized; Acid reflux; Overweight; Dysphagia; Dyspepsia; Infertility, female; Supervision of high risk pregnancy, antepartum; Dichorionic diamniotic twin pregnancy; and Rubella non-immune status, antepartum on their problem list.  Patient reports no complaints.  Contractions: Not present. Vag. Bleeding: None.  Movement: Present. Denies leaking of fluid.   The following portions of the patient's history were reviewed and updated as appropriate: allergies, current medications, past family history, past medical history, past social history, past surgical history and problem list. Problem list updated.  Objective:   Vitals:   10/05/19 1016 10/05/19 1024  BP: (!) 155/104 (!) 139/98  Pulse: 90 84  Weight: 255 lb (115.7 kg)     Fetal Status: Fetal Heart Rate (bpm): BPP Fundal Height: 46 cm Movement: Present     General:  Alert, oriented and cooperative. Patient is in no acute distress.  Skin: Skin is warm and dry. No rash noted.   Cardiovascular: Normal heart rate noted  Respiratory: Normal respiratory effort, no problems with respiration noted  Abdomen: Soft, gravid, appropriate for gestational age.  Pain/Pressure: Absent     Pelvic: Cervical exam deferred        Extremities: Normal range of motion.  Edema: Trace  Mental Status: Normal mood and affect. Normal behavior. Normal judgment and thought content.   Assessment and Plan:  Pregnancy: G1P0000 at [redacted]w[redacted]d  1. Supervision of high risk pregnancy, antepartum - No changes to current plan of care - Chronic HTN, BP reviewed with Dr. Despina Hidden, medication not indicated at this time - Confirmed IOL no later than 36 weeks  2. Dichorionic diamniotic twin pregnancy in third trimester - Twin A, cephalic left, FHT 159, BPP  8/8, Doppler 9.6% - Twin B, cephalic right, FHT 129, BPP 8/8, Doppler 10%  3. Screening for genitourinary condition  - POC Urinalysis Dipstick OB  4. Pregnant state, incidental - POC Urinalysis Dipstick OB  5. Need for vaccination - Tdap vaccine greater than or equal to 7yo IM  Preterm labor symptoms and general obstetric precautions including but not limited to vaginal bleeding, contractions, leaking of fluid and fetal movement were reviewed in detail with the patient. Please refer to After Visit Summary for other counseling recommendations.  No follow-ups on file.  Future Appointments  Date Time Provider Department Center  10/09/2019  9:10 AM Tilda Burrow, MD [facility]  10/10/2019 11:00 AM Zena Amos, MD [facility] None  10/12/2019  9:15 AM [facility] [place] [facility] None    Calvert Cantor, CNM

## 2019-10-05 NOTE — Progress Notes (Signed)
[place] 33+6 wks DI/DI twins BABY A:cephalic left,anterior placenta gr 2,BPP 8/8,SVP of fluid 4.8 cm,fhr 159 bpm,RI .42,.46,.49,.54=9.6% BABY B:cephalic right,posterior placenta gr 2,BPP 8/8,SVP of fluid 3.9 cm,FHR 129 bpm,RI .48,.55=10%

## 2019-10-09 ENCOUNTER — Other Ambulatory Visit: Payer: Self-pay

## 2019-10-09 ENCOUNTER — Ambulatory Visit (INDEPENDENT_AMBULATORY_CARE_PROVIDER_SITE_OTHER): Payer: PRIVATE HEALTH INSURANCE | Admitting: Obstetrics and Gynecology

## 2019-10-09 ENCOUNTER — Encounter: Payer: Self-pay | Admitting: Obstetrics and Gynecology

## 2019-10-09 VITALS — BP 142/99 | HR 84 | Wt 254.0 lb

## 2019-10-09 DIAGNOSIS — O0993 Supervision of high risk pregnancy, unspecified, third trimester: Secondary | ICD-10-CM

## 2019-10-09 DIAGNOSIS — Z3A34 34 weeks gestation of pregnancy: Secondary | ICD-10-CM | POA: Diagnosis not present

## 2019-10-09 DIAGNOSIS — Z331 Pregnant state, incidental: Secondary | ICD-10-CM

## 2019-10-09 DIAGNOSIS — O139 Gestational [pregnancy-induced] hypertension without significant proteinuria, unspecified trimester: Secondary | ICD-10-CM

## 2019-10-09 DIAGNOSIS — O133 Gestational [pregnancy-induced] hypertension without significant proteinuria, third trimester: Secondary | ICD-10-CM

## 2019-10-09 DIAGNOSIS — O30043 Twin pregnancy, dichorionic/diamniotic, third trimester: Secondary | ICD-10-CM

## 2019-10-09 DIAGNOSIS — Z1389 Encounter for screening for other disorder: Secondary | ICD-10-CM

## 2019-10-09 DIAGNOSIS — O0990 Supervision of high risk pregnancy, unspecified, unspecified trimester: Secondary | ICD-10-CM

## 2019-10-09 LAB — POCT URINALYSIS DIPSTICK OB
Blood, UA: NEGATIVE
Glucose, UA: NEGATIVE
Ketones, UA: NEGATIVE
Nitrite, UA: NEGATIVE
POC,PROTEIN,UA: NEGATIVE

## 2019-10-09 MED ORDER — BETAMETHASONE SOD PHOS & ACET 6 (3-3) MG/ML IJ SUSP
12.0000 mg | Freq: Once | INTRAMUSCULAR | Status: AC
  Administered 2019-10-09: 12 mg via INTRAMUSCULAR

## 2019-10-09 NOTE — Progress Notes (Signed)
Patient ID: Rachel Roy, female   DOB: [birth year], 34 y.o.   MRN: [id number]  .   HIGH-RISK PREGNANCY VISIT Patient name: Rachel Roy MRN [id number]  Date of birth: 23-Jul-1986 Chief Complaint:   High Risk Gestation (NST)  History of Present Illness:   Rachel Roy is a 34 y.o. G1P0000 female at [redacted]w[redacted]d with an Estimated Date of Delivery: 11/17/19 being seen today for ongoing management of a high-risk pregnancy complicated by multiple gestation di/di twins. Today she reports headache but relates them to allergies. She has sinus pressure at night. She denies taking any  Medication for her sinuses. 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 Her workload has increased drastically in the past 2 weeks.  Contractions: Not present. Vag. Bleeding: None.  Movement: Present. denies leaking of fluid. She plans to go to [facility]. BP readings brought today. See below.      Review of Systems:   Pertinent items are noted in HPI Denies abnormal vaginal discharge w/ itching/odor/irritation, headaches, visual changes, shortness of breath, chest pain, abdominal pain, severe nausea/vomiting, or problems with urination or bowel movements unless otherwise stated above. Pertinent History Reviewed:  Reviewed past medical,surgical, social, obstetrical and family history.  Reviewed problem list, medications and allergies. Physical Assessment:   Vitals:   10/09/19 0916  BP: (!) 142/99  Pulse: 84  Weight: 254 lb (115.2 kg)  Body mass index is 40.75 kg/m.           Physical Examination:   General appearance: oriented to person, place, and time  Mental status: alert, oriented to person, place, and time, normal mood, behavior, speech, dress, motor activity, and thought processes, affect appropriate to mood  Skin: warm & dry   Extremities: Edema: Trace    Cardiovascular: normal heart rate noted  Respiratory: normal respiratory effort, no distress  Abdomen: gravid, soft,  non-tender  Pelvic: Cervical exam deferred, vertex         Fetal Status: Fetal Heart Rate (bpm): NST Fundal Height: 44 cm Movement: Present    Fetal Surveillance Testing today: NST x 2  Chaperone: Samul Dada    Results for orders placed or performed in visit on 10/09/19 (from the past 24 hour(s))  POC Urinalysis Dipstick OB   Collection Time: 10/09/19  9:17 AM  Result Value Ref Range   Color, UA     Clarity, UA     Glucose, UA Negative Negative   Bilirubin, UA     Ketones, UA neg    Spec Grav, UA     Blood, UA neg    pH, UA     POC,PROTEIN,UA Negative Negative, Trace, Small (1+), Moderate (2+), Large (3+), 4+   Urobilinogen, UA     Nitrite, UA neg    Leukocytes, UA Trace (A) Negative   Appearance     Odor      Assessment & Plan:  1) High-risk pregnancy G1P0000 at [redacted]w[redacted]d with an Estimated Date of Delivery: 11/17/19   2) DiDi Twins, stable  3) GHTN,  Increasing home pressures , lower here.  Recheck CBC, CMP, Urine PR/CR Betamethasone  12 mg repeat this am and tomorrow.   Meds: No orders of the defined types were placed in this encounter.  Labs/procedures today: NST, UA  Cbc cmet  Treatment Plan:   F/u BPP 10/12/2019,  Betamethasone today and tomorrow W/ BP CHECK   Reviewed: Preterm labor symptoms and general obstetric precautions including but not limited to vaginal bleeding, contractions, leaking of  fluid and fetal movement were reviewed in detail with the patient.  All questions were answered. checks home bp cuff. Check bp weekly, let us know if >140/90.   Follow-up: Return in about 3 days (around 10/12/2019) for BPP.  Orders Placed This Encounter  Procedures  . POC Urinalysis Dipstick OB   By signing my name below, I, Arnette Norris, attest that this documentation has been prepared under the direction and in the presence of Tilda Burrow, MD. Electronically Signed: Arnette Norris Medical Scribe. 10/09/19. 10:10 AM.  I personally performed the services described  in this documentation, which was SCRIBED in my presence. The recorded information has been reviewed and considered accurate. It has been edited as necessary during review. Tilda Burrow, MD

## 2019-10-10 ENCOUNTER — Encounter: Payer: Self-pay | Admitting: *Deleted

## 2019-10-10 ENCOUNTER — Ambulatory Visit (INDEPENDENT_AMBULATORY_CARE_PROVIDER_SITE_OTHER): Payer: PRIVATE HEALTH INSURANCE | Admitting: *Deleted

## 2019-10-10 ENCOUNTER — Other Ambulatory Visit: Payer: Self-pay

## 2019-10-10 ENCOUNTER — Ambulatory Visit (INDEPENDENT_AMBULATORY_CARE_PROVIDER_SITE_OTHER): Payer: PRIVATE HEALTH INSURANCE | Admitting: Psychiatry

## 2019-10-10 ENCOUNTER — Encounter: Payer: Self-pay | Admitting: Psychiatry

## 2019-10-10 VITALS — BP 147/98 | HR 102 | Ht 67.0 in | Wt 257.0 lb

## 2019-10-10 DIAGNOSIS — O09213 Supervision of pregnancy with history of pre-term labor, third trimester: Secondary | ICD-10-CM

## 2019-10-10 DIAGNOSIS — F411 Generalized anxiety disorder: Secondary | ICD-10-CM | POA: Diagnosis not present

## 2019-10-10 DIAGNOSIS — Z3A34 34 weeks gestation of pregnancy: Secondary | ICD-10-CM | POA: Diagnosis not present

## 2019-10-10 DIAGNOSIS — F4310 Post-traumatic stress disorder, unspecified: Secondary | ICD-10-CM

## 2019-10-10 DIAGNOSIS — Z1389 Encounter for screening for other disorder: Secondary | ICD-10-CM

## 2019-10-10 DIAGNOSIS — O139 Gestational [pregnancy-induced] hypertension without significant proteinuria, unspecified trimester: Secondary | ICD-10-CM

## 2019-10-10 DIAGNOSIS — Z331 Pregnant state, incidental: Secondary | ICD-10-CM

## 2019-10-10 LAB — COMPREHENSIVE METABOLIC PANEL
ALT: 11 IU/L (ref 0–32)
AST: 18 IU/L (ref 0–40)
Albumin/Globulin Ratio: 1 — ABNORMAL LOW (ref 1.2–2.2)
Albumin: 2.9 g/dL — ABNORMAL LOW (ref 3.8–4.8)
Alkaline Phosphatase: 223 IU/L — ABNORMAL HIGH (ref 39–117)
BUN/Creatinine Ratio: 7 — ABNORMAL LOW (ref 9–23)
BUN: 7 mg/dL (ref 6–20)
Bilirubin Total: 0.2 mg/dL (ref 0.0–1.2)
CO2: 18 mmol/L — ABNORMAL LOW (ref 20–29)
Calcium: 9.5 mg/dL (ref 8.7–10.2)
Chloride: 104 mmol/L (ref 96–106)
Creatinine, Ser: 0.95 mg/dL (ref 0.57–1.00)
GFR calc Af Amer: 91 mL/min/{1.73_m2} (ref >59)
GFR calc non Af Amer: 79 mL/min/{1.73_m2} (ref >59)
Globulin, Total: 2.8 g/dL (ref 1.5–4.5)
Glucose: 79 mg/dL (ref 65–99)
Potassium: 4.2 mmol/L (ref 3.5–5.2)
Sodium: 137 mmol/L (ref 134–144)
Total Protein: 5.7 g/dL — ABNORMAL LOW (ref 6.0–8.5)

## 2019-10-10 LAB — PROTEIN / CREATININE RATIO, URINE
Creatinine, Urine: 64.8 mg/dL
Protein, Ur: 22.7 mg/dL
Protein/Creat Ratio: 350 mg/g creat — ABNORMAL HIGH (ref 0–200)

## 2019-10-10 LAB — POCT URINALYSIS DIPSTICK OB
Blood, UA: NEGATIVE
Glucose, UA: NEGATIVE
Ketones, UA: NEGATIVE
Nitrite, UA: NEGATIVE
POC,PROTEIN,UA: NEGATIVE

## 2019-10-10 LAB — CBC
Hematocrit: 36.1 % (ref 34.0–46.6)
Hemoglobin: 11.7 g/dL (ref 11.1–15.9)
MCH: 24.7 pg — ABNORMAL LOW (ref 26.6–33.0)
MCHC: 32.4 g/dL (ref 31.5–35.7)
MCV: 76 fL — ABNORMAL LOW (ref 79–97)
Platelets: 187 10*3/uL (ref 150–450)
RBC: 4.74 x10E6/uL (ref 3.77–5.28)
RDW: 14.5 % (ref 11.7–15.4)
WBC: 8 10*3/uL (ref 3.4–10.8)

## 2019-10-10 MED ORDER — DULOXETINE HCL 40 MG PO CPEP: 40.0000 mg | ORAL_CAPSULE | Freq: Every day | ORAL | 1 refills | Status: DC

## 2019-10-10 MED ORDER — BETAMETHASONE SOD PHOS & ACET 6 (3-3) MG/ML IJ SUSP
12.0000 mg | Freq: Once | INTRAMUSCULAR | Status: AC
  Administered 2019-10-10: 12 mg via INTRAMUSCULAR

## 2019-10-10 NOTE — Progress Notes (Signed)
BH MD/PA/NP OP Progress Note  I connected with  Rachel Roy on 10/10/19 by a video enabled telemedicine application and verified that I am speaking with the correct person using two identifiers.   I discussed the limitations of evaluation and management by telemedicine. The patient expressed understanding and agreed to proceed.    10/10/2019 11:02 AM Rachel Roy  MRN:  [id number]  Chief Complaint: " I have been doing well."  HPI: Patient reported that she has been doing well on duloxetine 40 mg daily.  She stated her anxiety and mood have been well controlled.  She denied any acute issues or concerns at this time.  She is pregnant with twins.  She informed that she has developed gestational hypertension and she is 34 weeks at this point.  She stated that there is possibility that she will be induced at 36 weeks and she is gearing up for that.    Visit Diagnosis:    ICD-10-CM   1. GAD (generalized anxiety disorder)  F41.1   2. PTSD (post-traumatic stress disorder)  F43.10     Past Psychiatric History: Anxiety, PTSD Past Medical History:  Past Medical History:  Diagnosis Date  . Anxiety   . GERD (gastroesophageal reflux disease)   . Heart murmur     Past Surgical History:  Procedure Laterality Date  . BREAST SURGERY  2010   reduction    Family Psychiatric History:denied  Family History:  Family History  Problem Relation Age of Onset  . Asthma Mother        childhood  . Cancer Mother        skin cancer  . Anxiety disorder Mother   . Hyperlipidemia Father   . Arthritis Maternal Grandmother   . Glaucoma Maternal Grandmother   . Heart disease Maternal Grandmother   . Heart disease Maternal Grandfather        died cancer, heart at 80s  . Hyperlipidemia Maternal Grandfather   . Hypertension Maternal Grandfather   . Stroke Maternal Grandfather   . Cancer Maternal Grandfather        skin  . Cancer Paternal Grandmother        pancreatic  . Cancer Paternal  Grandfather 54  . Colon cancer Neg Hx     Social History:  Social History   Socioeconomic History  . Marital status: Married    Spouse name: Rachel Roy  . Number of children: 0  . Years of education: 78  . Highest education level: Not on file  Occupational History  . Occupation: Data processing manager    Comment: [employer]  Tobacco Use  . Smoking status: Never Smoker  . Smokeless tobacco: Never Used  Substance and Sexual Activity  . Alcohol use: Not Currently    Comment: occasionally  . Drug use: No  . Sexual activity: Yes    Birth control/protection: None  Other Topics Concern  . Not on file  Social History Narrative   Lives with husband Rachel Roy   Both in [place] from [place]   2 dogs, One Event organiser to [employer]:   . Difficulty of Paying Living Expenses: Not on file  Food Insecurity:   . Worried About Programme researcher, broadcasting/film/video in the Last Year: Not on file  . Ran Out of Food in the Last Year: Not on file  Transportation Needs:   . Lack of Transportation (Medical): Not on file  .  Lack of Transportation (Non-Medical): Not on file  Physical Activity:   . Days of Exercise per Week: Not on file  . Minutes of Exercise per Session: Not on file  Stress:   . Feeling of Stress : Not on file  Social Connections:   . Frequency of Communication with Friends and Family: Not on file  . Frequency of Social Gatherings with Friends and Family: Not on file  . Attends Religious Services: Not on file  . Active Member of Clubs or Organizations: Not on file  . Attends Banker Meetings: Not on file  . Marital Status: Not on file    Allergies: No Known Allergies  Metabolic Disorder Labs: No results found for: HGBA1C, MPG No results found for: PROLACTIN Lab Results  Component Value Date   CHOL 186 12/09/2017   TRIG 123 12/09/2017   HDL 43 (L) 12/09/2017   CHOLHDL 4.3 12/09/2017   LDLCALC 120 (H)  12/09/2017   Lab Results  Component Value Date   TSH 1.81 01/25/2018    Therapeutic Level Labs: No results found for: LITHIUM No results found for: VALPROATE No components found for:  CBMZ  Current Medications: Current Outpatient Medications  Medication Sig Dispense Refill  . aspirin EC 81 MG tablet Take 81 mg by mouth daily.    . DULoxetine HCl 40 MG CPEP Take 40 mg by mouth daily. 90 capsule 1  . Prenatal Vit-Fe Fumarate-FA (PRENATAL VITAMINS PO) Take by mouth daily.     No current facility-administered medications for this visit.     Musculoskeletal: Strength & Muscle Tone: unable to assess due to telemed visit Gait & Station: unable to assess due to telemed visit Patient leans: unable to assess due to telemed visit   Psychiatric Specialty Exam: Review of Systems  Last menstrual period 02/10/2019.There is no height or weight on file to calculate BMI.  General Appearance: Fairly Groomed  Eye Contact:  Good  Speech:  Clear and Coherent and Normal Rate  Volume:  Normal  Mood:  Euthymic  Affect:  Congruent  Thought Process:  Goal Directed and Descriptions of Associations: Intact  Orientation:  Full (Time, Place, and Person)  Thought Content: Logical   Suicidal Thoughts:  No  Homicidal Thoughts:  No  Memory:  Immediate;   Good Recent;   Good  Judgement:  Good  Insight:  Good  Psychomotor Activity:  Normal  Concentration:  Concentration: Good and Attention Span: Good  Recall:  Good  Fund of Knowledge: Good  Language: Good  Akathisia:  Negative  Handed:  Right  AIMS (if indicated): not done  Assets:  Communication Skills Desire for Improvement Financial Resources/Insurance Housing Social Support  ADL's:  Intact  Cognition: WNL  Sleep:  Fair   Screenings: PHQ2-9     Initial Prenatal from 05/09/2019 in [facility] Office Visit from 10/26/2017 in [facility] Office Visit from 12/10/2016 in [facility]  PHQ-2 Total Score  1  6   0  PHQ-9 Total Score  8  10  --       Assessment and Plan: Patient reported doing well on duloxetine 40 mg dose for now.  1. GAD (generalized anxiety disorder)  - DULoxetine HCl 40 MG CPEP; Take 40 mg by mouth daily.  Dispense: 90 capsule; Refill: 1  2. PTSD (post-traumatic stress disorder)  - DULoxetine HCl 40 MG CPEP; Take 40 mg by mouth daily.  Dispense: 90 capsule; Refill: 1  Continue same medication regimen. Follow  up in 3 months.    Nevada Crane, MD 10/10/2019, 11:02 AM

## 2019-10-10 NOTE — Progress Notes (Signed)
   NURSE VISIT- BLOOD PRESSURE CHECK  SUBJECTIVE:  Rachel Roy is a 34 y.o. G33P0000 female here for BP check. She is [redacted]w[redacted]d pregnant    HYPERTENSION ROS:  Pregnant/postpartum:  . Severe headaches that don't go away with tylenol/other medicines: No  . Visual changes (seeing spots/double/blurred vision) No  . Severe pain under right breast breast or in center of upper chest No  . Severe nausea/vomiting No  . Taking medicines as instructed not applicable  .   OBJECTIVE:  BP (!) 147/98 (BP Location: Right Arm, Patient Position: Sitting, Cuff Size: Large)   Pulse (!) 102   Ht 5\' 7"  (1.702 m)   Wt 257 lb (116.6 kg)   LMP 02/10/2019   BMI 40.25 kg/m   Appearance alert, well appearing, and in no distress.  ASSESSMENT: Pregnancy [redacted]w[redacted]d  blood pressure check  PLAN: Discussed with Dr. [redacted]w[redacted]d   Recommendations: no changes needed   Follow-up: as scheduled   Emelda Fear  10/10/2019 9:44 AM

## 2019-10-11 ENCOUNTER — Other Ambulatory Visit: Payer: Self-pay

## 2019-10-11 ENCOUNTER — Inpatient Hospital Stay (HOSPITAL_COMMUNITY)
Admission: AD | Admit: 2019-10-11 | Discharge: 2019-10-18 | DRG: 787 | Disposition: A | Discharge status: Home / Self Care | Payer: 59 | Attending: Obstetrics and Gynecology | Admitting: Obstetrics and Gynecology

## 2019-10-11 ENCOUNTER — Telehealth: Payer: Self-pay | Admitting: Obstetrics and Gynecology

## 2019-10-11 ENCOUNTER — Other Ambulatory Visit: Payer: Self-pay | Admitting: Obstetrics and Gynecology

## 2019-10-11 ENCOUNTER — Encounter (HOSPITAL_COMMUNITY): Payer: Self-pay | Admitting: Obstetrics and Gynecology

## 2019-10-11 DIAGNOSIS — N179 Acute kidney failure, unspecified: Secondary | ICD-10-CM

## 2019-10-11 DIAGNOSIS — O30043 Twin pregnancy, dichorionic/diamniotic, third trimester: Secondary | ICD-10-CM

## 2019-10-11 DIAGNOSIS — O99344 Other mental disorders complicating childbirth: Secondary | ICD-10-CM | POA: Diagnosis present

## 2019-10-11 DIAGNOSIS — O10919 Unspecified pre-existing hypertension complicating pregnancy, unspecified trimester: Secondary | ICD-10-CM

## 2019-10-11 DIAGNOSIS — Z3A34 34 weeks gestation of pregnancy: Secondary | ICD-10-CM

## 2019-10-11 DIAGNOSIS — O134 Gestational [pregnancy-induced] hypertension without significant proteinuria, complicating childbirth: Secondary | ICD-10-CM | POA: Diagnosis not present

## 2019-10-11 DIAGNOSIS — O1414 Severe pre-eclampsia complicating childbirth: Secondary | ICD-10-CM | POA: Diagnosis not present

## 2019-10-11 DIAGNOSIS — F411 Generalized anxiety disorder: Secondary | ICD-10-CM | POA: Diagnosis present

## 2019-10-11 DIAGNOSIS — E8770 Fluid overload, unspecified: Secondary | ICD-10-CM | POA: Diagnosis present

## 2019-10-11 DIAGNOSIS — O42913 Preterm premature rupture of membranes, unspecified as to length of time between rupture and onset of labor, third trimester: Secondary | ICD-10-CM | POA: Diagnosis present

## 2019-10-11 DIAGNOSIS — Z20822 Contact with and (suspected) exposure to covid-19: Secondary | ICD-10-CM | POA: Diagnosis present

## 2019-10-11 DIAGNOSIS — O42113 Preterm premature rupture of membranes, onset of labor more than 24 hours following rupture, third trimester: Secondary | ICD-10-CM | POA: Diagnosis not present

## 2019-10-11 DIAGNOSIS — F4310 Post-traumatic stress disorder, unspecified: Secondary | ICD-10-CM | POA: Diagnosis present

## 2019-10-11 DIAGNOSIS — O1493 Unspecified pre-eclampsia, third trimester: Secondary | ICD-10-CM | POA: Diagnosis not present

## 2019-10-11 DIAGNOSIS — E871 Hypo-osmolality and hyponatremia: Secondary | ICD-10-CM | POA: Diagnosis present

## 2019-10-11 DIAGNOSIS — O904 Postpartum acute kidney failure: Secondary | ICD-10-CM | POA: Diagnosis not present

## 2019-10-11 DIAGNOSIS — O30049 Twin pregnancy, dichorionic/diamniotic, unspecified trimester: Secondary | ICD-10-CM | POA: Diagnosis present

## 2019-10-11 DIAGNOSIS — O4210 Premature rupture of membranes, onset of labor more than 24 hours following rupture, unspecified weeks of gestation: Secondary | ICD-10-CM

## 2019-10-11 DIAGNOSIS — O99214 Obesity complicating childbirth: Secondary | ICD-10-CM | POA: Diagnosis present

## 2019-10-11 DIAGNOSIS — Z3689 Encounter for other specified antenatal screening: Secondary | ICD-10-CM | POA: Diagnosis not present

## 2019-10-11 DIAGNOSIS — Z98891 History of uterine scar from previous surgery: Secondary | ICD-10-CM

## 2019-10-11 DIAGNOSIS — O99284 Endocrine, nutritional and metabolic diseases complicating childbirth: Secondary | ICD-10-CM | POA: Diagnosis present

## 2019-10-11 DIAGNOSIS — Z2839 Other underimmunization status: Secondary | ICD-10-CM

## 2019-10-11 DIAGNOSIS — Z283 Underimmunization status: Secondary | ICD-10-CM

## 2019-10-11 DIAGNOSIS — O0990 Supervision of high risk pregnancy, unspecified, unspecified trimester: Secondary | ICD-10-CM

## 2019-10-11 DIAGNOSIS — Z363 Encounter for antenatal screening for malformations: Secondary | ICD-10-CM | POA: Diagnosis not present

## 2019-10-11 DIAGNOSIS — O1490 Unspecified pre-eclampsia, unspecified trimester: Secondary | ICD-10-CM | POA: Diagnosis not present

## 2019-10-11 DIAGNOSIS — O99891 Other specified diseases and conditions complicating pregnancy: Secondary | ICD-10-CM

## 2019-10-11 DIAGNOSIS — Z3A35 35 weeks gestation of pregnancy: Secondary | ICD-10-CM | POA: Diagnosis not present

## 2019-10-11 HISTORY — DX: Unspecified pre-eclampsia, third trimester: O14.93

## 2019-10-11 LAB — COMPREHENSIVE METABOLIC PANEL
ALT: 26 U/L (ref 0–44)
AST: 32 U/L (ref 15–41)
Albumin: 2.1 g/dL — ABNORMAL LOW (ref 3.5–5.0)
Alkaline Phosphatase: 159 U/L — ABNORMAL HIGH (ref 38–126)
Anion gap: 9 (ref 5–15)
BUN: 9 mg/dL (ref 6–20)
CO2: 20 mmol/L — ABNORMAL LOW (ref 22–32)
Calcium: 8.5 mg/dL — ABNORMAL LOW (ref 8.9–10.3)
Chloride: 108 mmol/L (ref 98–111)
Creatinine, Ser: 1.11 mg/dL — ABNORMAL HIGH (ref 0.44–1.00)
GFR calc Af Amer: >60 mL/min (ref >60)
GFR calc non Af Amer: >60 mL/min (ref >60)
Glucose, Bld: 122 mg/dL — ABNORMAL HIGH (ref 70–99)
Potassium: 3.6 mmol/L (ref 3.5–5.1)
Sodium: 137 mmol/L (ref 135–145)
Total Bilirubin: 0.2 mg/dL — ABNORMAL LOW (ref 0.3–1.2)
Total Protein: 5.2 g/dL — ABNORMAL LOW (ref 6.5–8.1)

## 2019-10-11 LAB — SARS CORONAVIRUS 2 (TAT 6-24 HRS): SARS Coronavirus 2: NEGATIVE

## 2019-10-11 LAB — ABO/RH: ABO/RH(D): AB POS

## 2019-10-11 LAB — OB RESULTS CONSOLE GBS: GBS: NEGATIVE

## 2019-10-11 LAB — CBC
HCT: 32.2 % — ABNORMAL LOW (ref 36.0–46.0)
Hemoglobin: 10.1 g/dL — ABNORMAL LOW (ref 12.0–15.0)
MCH: 24.8 pg — ABNORMAL LOW (ref 26.0–34.0)
MCHC: 31.4 g/dL (ref 30.0–36.0)
MCV: 79.1 fL — ABNORMAL LOW (ref 80.0–100.0)
Platelets: 216 10*3/uL (ref 150–400)
RBC: 4.07 MIL/uL (ref 3.87–5.11)
RDW: 15.3 % (ref 11.5–15.5)
WBC: 11 10*3/uL — ABNORMAL HIGH (ref 4.0–10.5)
nRBC: 0.5 % — ABNORMAL HIGH (ref 0.0–0.2)

## 2019-10-11 LAB — PROTEIN / CREATININE RATIO, URINE
Creatinine, Urine: 38.1 mg/dL
Protein Creatinine Ratio: 0.24 mg/mg{Cre} — ABNORMAL HIGH (ref 0.00–0.15)
Total Protein, Urine: 9 mg/dL

## 2019-10-11 LAB — TYPE AND SCREEN
ABO/RH(D): AB POS
Antibody Screen: NEGATIVE

## 2019-10-11 MED ORDER — PRENATAL MULTIVITAMIN CH: 1.0000 | ORAL_TABLET | Freq: Every day | ORAL | Status: DC

## 2019-10-11 MED ORDER — ACETAMINOPHEN 325 MG PO TABS: 650.0000 mg | ORAL_TABLET | ORAL | Status: DC | PRN

## 2019-10-11 MED ORDER — CALCIUM CARBONATE ANTACID 500 MG PO CHEW: 2.0000 | CHEWABLE_TABLET | ORAL | Status: DC | PRN

## 2019-10-11 MED ORDER — ZOLPIDEM TARTRATE 5 MG PO TABS: 5.0000 mg | ORAL_TABLET | Freq: Every evening | ORAL | Status: DC | PRN

## 2019-10-11 MED ORDER — ASPIRIN 81 MG PO CHEW
81.0000 mg | CHEWABLE_TABLET | Freq: Every day | ORAL | Status: DC
  Filled 2019-10-11: qty 1

## 2019-10-11 MED ORDER — DOCUSATE SODIUM 100 MG PO CAPS
100.0000 mg | ORAL_CAPSULE | Freq: Every day | ORAL | Status: DC
  Administered 2019-10-12 – 2019-10-13 (×2): 100 mg via ORAL
  Filled 2019-10-11 (×2): qty 1

## 2019-10-11 MED ORDER — DULOXETINE HCL 20 MG PO CPEP
40.0000 mg | ORAL_CAPSULE | Freq: Every day | ORAL | Status: DC
  Administered 2019-10-13: 40 mg via ORAL
  Filled 2019-10-11 (×4): qty 2

## 2019-10-11 NOTE — Progress Notes (Signed)
Pt brought prescription and non-prescription medications from home. Pt states wants to take medications brought from home and not from here. Pt educated on why she was asked to bring medications from home and why it's important for [place] to know what she is taking.

## 2019-10-11 NOTE — Telephone Encounter (Signed)
Case discussed with Dr Judeth Cornfield. Since BP's at home are higher than office, and we need to be quite certain she is not running as high as her last note 1/25 documented, will admit to [facility] for 24+ hours of bp's and repeat labs, obtain Tomorrow's scheduled BPP while in hospital, and decide if the goal remains 36 wk or if earlier delivery indicated. Also,  Pr/Cr ratio increasing slowly, now 350 (0.35) so will call preeclampsia rather than Gest HTN,  And Platelets are 180's down from 300's earlier in pregnancy.  Patient agrees to 24+ hrs in hospital to clarify BP status.  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 Will Notify Ante and second Attending.  Admission approved by Marthann Schiller of NICU, and Dr Debroah Loop Aware. Ante notified. Pt to arrive by 3.

## 2019-10-11 NOTE — H&P (Signed)
ANTEPARTUM ADMISSION HISTORY AND PHYSICAL NOTE   History of Present Illness: Rachel Roy is a 34 y.o. G1P0000 at [redacted]w[redacted]d admitted for [facility], probable preeclampsia with di/di twins.  She was sent by Dr Emelda Fear due to elevated BP reported at home. Plan had been to deliver at 36 weeks with [facility] Patient reports the fetal movement as active. Patient reports uterine contraction  activity as none. Patient reports  vaginal bleeding as none. Patient describes fluid per vagina as None. Fetal presentation is cephalic.  Patient Active Problem List   Diagnosis Date Noted  . Preeclampsia, third trimester 10/11/2019  . PTSD (post-traumatic stress disorder) 10/10/2019  . Rubella non-immune status, antepartum 05/16/2019  . Supervision of high risk pregnancy, antepartum 05/09/2019  . Dichorionic diamniotic twin pregnancy 05/09/2019  . Infertility, female 10/26/2017  . Dyspepsia 03/19/2017  . Dysphagia 01/06/2017  . GAD (generalized anxiety disorder) 12/10/2016  . Acid reflux 12/10/2016  . Overweight 12/10/2016    Past Medical History:  Diagnosis Date  . Anxiety   . GERD (gastroesophageal reflux disease)   . Heart murmur   . Preeclampsia, third trimester 10/11/2019    Past Surgical History:  Procedure Laterality Date  . BREAST SURGERY  2010   reduction    OB History  Gravida Para Term Preterm AB Living  1 0 0 0 0 0  SAB TAB Ectopic Multiple Live Births  0 0 0 0 0    # Outcome Date GA Lbr Len/2nd Weight Sex Delivery Anes PTL Lv  1 Current             Social History   Socioeconomic History  . Marital status: Married    Spouse name: Ethelene Browns  . Number of children: 0  . Years of education: 56  . Highest education level: Not on file  Occupational History  . Occupation: Data processing manager    Comment: [employer]  Tobacco Use  . Smoking status: Never Smoker  . Smokeless tobacco: Never Used  Substance and Sexual Activity  . Alcohol use: Not Currently    Comment:  occasionally  . Drug use: No  . Sexual activity: Yes    Birth control/protection: None  Other Topics Concern  . Not on file  Social History Narrative   Lives with husband Ethelene Browns   Both in [place] from [place]   2 dogs, One Event organiser to [employer]:   . Difficulty of Paying Living Expenses: Not on file  Food Insecurity:   . Worried About Programme researcher, broadcasting/film/video in the Last Year: Not on file  . Ran Out of Food in the Last Year: Not on file  Transportation Needs:   . Lack of Transportation (Medical): Not on file  . Lack of Transportation (Non-Medical): Not on file  Physical Activity:   . Days of Exercise per Week: Not on file  . Minutes of Exercise per Session: Not on file  Stress:   . Feeling of Stress : Not on file  Social Connections:   . Frequency of Communication with Friends and Family: Not on file  . Frequency of Social Gatherings with Friends and Family: Not on file  . Attends Religious Services: Not on file  . Active Member of Clubs or Organizations: Not on file  . Attends Banker Meetings: Not on file  . Marital Status: Not on file    Family History  Problem Relation Age  of Onset  . Asthma Mother        childhood  . Cancer Mother        skin cancer  . Anxiety disorder Mother   . Hyperlipidemia Father   . Arthritis Maternal Grandmother   . Glaucoma Maternal Grandmother   . Heart disease Maternal Grandmother   . Heart disease Maternal Grandfather        died cancer, heart at 18s  . Hyperlipidemia Maternal Grandfather   . Hypertension Maternal Grandfather   . Stroke Maternal Grandfather   . Cancer Maternal Grandfather        skin  . Cancer Paternal Grandmother        pancreatic  . Cancer Paternal Grandfather 45  . Colon cancer Neg Hx     No Known Allergies  Medications Prior to Admission  Medication Sig Dispense Refill Last Dose  . aspirin EC 81 MG tablet Take 81 mg by mouth  daily.     . DULoxetine HCl 40 MG CPEP Take 40 mg by mouth daily. 90 capsule 1   . Prenatal Vit-Fe Fumarate-FA (PRENATAL VITAMINS PO) Take by mouth daily.       Review of Systems - Negative except mild ankle edema  Vitals:  BP (!) 146/89 (BP Location: Left Arm)   Pulse 83   Temp (!) 97.1 F (36.2 C) (Oral) Comment: eating ice  Resp 18   Ht 5\' 7"  (1.702 m)   Wt 116.5 kg   LMP 02/10/2019   SpO2 100%   BMI 40.23 kg/m  Physical Examination: CONSTITUTIONAL: Well-developed, well-nourished female in no acute distress.  HENT:  Normocephalic, atraumatic, External right and left ear normal. Oropharynx is clear and moist EYES: Conjunctivae and EOM are normal. Pupils are equal, round, and reactive to light. No scleral icterus.  NECK: Normal range of motion, supple, no masses SKIN: Skin is warm and dry. No rash noted. Not diaphoretic. No erythema. No pallor. NEUROLGIC: Alert and oriented to person, place, and time. Normal reflexes, muscle tone coordination. No cranial nerve deficit noted. PSYCHIATRIC: Normal mood and affect. Normal behavior. Normal judgment and thought content. CARDIOVASCULAR: Normal heart rate noted, regular rhythm RESPIRATORY: Effort and breath sounds normal, no problems with respiration noted ABDOMEN: Soft, nontender, nondistended, gravid. MUSCULOSKELETAL: Normal range of motion. No edema and no tenderness. 2+ distal pulses.  Cervix: Not evaluated.  Membranes:intact   Labs:  No results found for this or any previous visit (from the past 24 hour(s)).  Imaging Studies: US OB Follow Up  Result Date: 09/12/2019 TWINS FOLLOW UP SONOGRAM Rachel Roy is in the office for a follow up sonogram of a twin gestation. She is a 34 y.o. year old G1P0000 with Estimated Date of Delivery: 11/17/19 by LMP now at  [redacted]w[redacted]d weeks gestation. Thus far the pregnancy has been otherwise complicated by DI/DI twin. The twins are dichorionic/diamniotic. TWIN A GESTATION: PRESENTATION: Cephalic  left FETAL ACTIVITY:          Heart rate         140          The fetus is active. AMNIOTIC FLUID: The amniotic fluid volume single deepest pocket is 4.3 cm. PLACENTA LOCALIZATION:  anterior GRADE 1 CERVIX: Measures 3.7 cm GESTATIONAL AGE AND  BIOMETRICS: Gestational criteria: Estimated Date of Delivery: 11/17/19 by LMP now at [redacted]w[redacted]d Previous Scans:5          BIPARIETAL DIAMETER           8.3 cm  33+3 weeks  98% HEAD CIRCUMFERENCE           30.03 cm         33+2 weeks ABDOMINAL CIRCUMFERENCE           27.16 cm         31+1 weeks FEMUR LENGTH           5.88 cm         30+4 weeks                                                       AVERAGE EGA(BY THIS SCAN):  32 weeks                                                 ESTIMATED FETAL WEIGHT:       1772  grams, 68 % ANATOMICAL SURVEY                                                                            COMMENTS CEREBRAL VENTRICLES yes normal  CHOROID PLEXUS yes normal  CEREBELLUM yes normal  CISTERNA MAGNA yes normal      ORBITS yes normal          FACIAL PROFILE yes normal  4 CHAMBERED HEART yes normal  OUTFLOW TRACTS yes normal  DIAPHRAGM yes normal  STOMACH yes normal  RENAL REGION yes normal  BLADDER yes normal      3 VESSEL CORD yes normal              GENITALIA yes normal female     SUSPECTED ABNORMALITIES:  no QUALITY OF SCAN: Satisfactory TWIN B GESTATION: PRESENTATION: Cephalic right superior FETAL ACTIVITY:          Heart rate         171          The fetus is active. AMNIOTIC FLUID: The amniotic fluid volume single deepest pocket is 5.2 cm. PLACENTA LOCALIZATION:  posterior GRADE 1 CERVIX: Measures 3.7 cm GESTATIONAL AGE AND  BIOMETRICS: Gestational criteria: Estimated Date of Delivery: 11/17/19 by LMP now at [redacted]w[redacted]d Previous Scans:5          BIPARIETAL DIAMETER           7.94 cm         31+6 weeks HEAD CIRCUMFERENCE           29.57 cm         32+4 weeks ABDOMINAL CIRCUMFERENCE           29.43 cm         33+2 weeks   98% FEMUR LENGTH           5.85 cm          30+3 weeks  AVERAGE EGA(BY THIS SCAN):  32 weeks                                                 ESTIMATED FETAL WEIGHT:       1962  grams, 91 % ANATOMICAL SURVEY                                                                            COMMENTS CEREBRAL VENTRICLES yes normal  CHOROID PLEXUS yes normal  CEREBELLUM yes normal                      FACIAL PROFILE yes normal  4 CHAMBERED HEART yes normal  OUTFLOW TRACTS yes normal  DIAPHRAGM yes normal  STOMACH yes normal  RENAL REGION yes normal  BLADDER yes normal      3 VESSEL CORD yes normal              GENITALIA yes normal female     SUSPECTED ABNORMALITIES:  no QUALITY OF SCAN: Satisfactory The current discrepancy in  fetal weight betwen the twins is 9.7% which  is not clinically relevant. TECHNICIAN COMMENTS: [place] 30+4 wks,DI/DI twins,cx 3.7 cm BABY A: cephalic left,female,anterior placenta gr 1,fhr 140 bpm,svp of fluid 4.7 cm,EFW 1772 g 68%,discordance 6.7% BABY B: cephalic right superior,female,posterior placenta gr 1,fhr 171 bpm,svp of fluid 5.2 cm,EFW 1962 g 91%,AC 98% A copy of this report including all images has been saved and backed up to a second source for retrieval if needed. All measures and details of the anatomical scan, placentation, fluid volume and pelvic anatomy are contained in that report. Amber Heide Guile 09/12/2019 10:04 AM Clinical Impression and recommendations: I have reviewed the sonogram results above, combined with the patient's current clinical course, below are my impressions and any appropriate recommendations for management based on the sonographic findings. 1.  G1P0000 Estimated Date of Delivery: 11/17/19 by serial sonographic evaluations 2.  Fetal sonographic surveillance findings: DiDi twins: cephalic/cephalic a). Normal fluid volume b). Normal growth percentile with appropriate interval growth, concordant growth 3.  Normal general sonographic findings Recommend continued prenatal  evaluations and care based on this sonogram and as clinically indicated from the patient's clinical course. Mertie Clause Eure 09/12/2019 10:30 AM  US Fetal BPP W/O Non Stress  Result Date: 10/05/2019 TWINS FOLLOW UP SONOGRAM Lorretta Kerce is in the office for a follow up sonogram of a twin gestation. She is a 34 y.o. year old G1P0000 with Estimated Date of Delivery: 11/17/19 by LMP now at  [redacted]w[redacted]d weeks gestation. Thus far the pregnancy has been otherwise complicated by DI/DI twins,CHTN. The twins are dichorionic/diamniotic. TWIN A GESTATION: PRESENTATION: Cephalic left FETAL ACTIVITY:          Heart rate         159          The fetus is active. AMNIOTIC FLUID: The amniotic fluid volume single deepest pocket is 4.8 cm. PLACENTA LOCALIZATION:  anterior GRADE 2 CERVIX: Limited view ADNEXA: wnl GESTATIONAL AGE AND  BIOMETRICS: Gestational criteria: Estimated Date  of Delivery: 11/17/19 by LMP now at [redacted]w[redacted]d Previous Scans:5 BIOPHYSICAL PROFILE:                                                                                                      COMMENTS GROSS BODY MOVEMENT                 2  TONE                2  RESPIRATIONS                2  AMNIOTIC FLUID                2                                                          SCORE:  8/8 (Note: NST was not performed as part of this antepartum testing) DOPPLER FLOW STUDIES: .42,.46,.49,.54=9.6% ANATOMICAL SURVEY                                                                            COMMENTS CEREBRAL VENTRICLES yes normal  CHOROID PLEXUS yes normal                              4 CHAMBERED HEART yes normal  OUTFLOW TRACTS yes normal  DIAPHRAGM yes normal  STOMACH yes normal  RENAL REGION yes normal  BLADDER yes normal      3 VESSEL CORD yes normal              GENITALIA   female     SUSPECTED ABNORMALITIES:  no QUALITY OF SCAN: Satisfactory TWIN B GESTATION: PRESENTATION: Cephalic right FETAL ACTIVITY:          Heart rate         129          The fetus is active.  AMNIOTIC FLUID: The amniotic fluid volume single deepest pocket is 3.9 cm. PLACENTA LOCALIZATION:  posterior GRADE 2 CERVIX: Limited view ADNEXA: wnl GESTATIONAL AGE AND  BIOMETRICS: Gestational criteria: Estimated Date of Delivery: 11/17/19 by LMP now at [redacted]w[redacted]d Previous Scans:5 BIOPHYSICAL PROFILE:  COMMENTS GROSS BODY MOVEMENT                 2  TONE                2  RESPIRATIONS                2  AMNIOTIC FLUID                2                                                          SCORE:  8/8 (Note: NST was not performed as part of this antepartum testing) DOPPLER FLOW STUDIES: UMBILICAL ARTERY RI RATIOS:  .48,.55=10% ANATOMICAL SURVEY                                                                            COMMENTS CEREBRAL VENTRICLES yes normal  CHOROID PLEXUS yes normal                      NOSE/LIP yes normal  FACIAL PROFILE yes normal  4 CHAMBERED HEART yes normal  OUTFLOW TRACTS yes normal  DIAPHRAGM yes normal  STOMACH yes normal  RENAL REGION yes normal  BLADDER yes normal      3 VESSEL CORD yes normal              GENITALIA   female     SUSPECTED ABNORMALITIES:  no QUALITY OF SCAN: Satisfactory TECHNICIAN COMMENTS: [place] 33+6 wks DI/DI twins BABY A:cephalic left,anterior placenta gr 2,BPP 8/8,SVP of fluid 4.8 cm,fhr 159 bpm,RI .42,.46,.49,.54=9.6% BABY B:cephalic right,posterior placenta gr 2,BPP 8/8,SVP of fluid 3.9 cm,FHR 129 bpm,RI .48,.55=10% A copy of this report including all images has been saved and backed up to a second source for retrieval if needed. All measures and details of the anatomical scan, placentation, fluid volume and pelvic anatomy are contained in that report. Amber Flora Lipps 10/05/2019 10:26 AM Clinical Impression and recommendations: I have reviewed the sonogram results above, combined with the patient's current clinical course, below are my impressions and any appropriate  recommendations for management based on the sonographic findings. 1.  G1P0000 Estimated Date of Delivery: 11/17/19 by serial sonographic evaluations 2.  Fetal sonographic surveillance findings: DiDi twins a). Normal fluid volume x 2 b). Normal antepartum fetal assessment with BPP 8/8 x 2 c). Normal fetal Doppler ratios with consistent diastolic flow x 2 3.  Normal general sonographic findings Recommend continued prenatal evaluations and care based on this sonogram and as clinically indicated from the patient's clinical course. Amaryllis Dyke Eure 10/05/2019 12:19 PM  US Fetal BPP W/O Non Stress  Result Date: 10/05/2019 TWINS FOLLOW UP SONOGRAM Shakenya Stoneberg is in the office for a follow up sonogram of a twin gestation. She is a 34 y.o. year old G1P0000 with Estimated Date of Delivery: 11/17/19 by LMP now at  [redacted]w[redacted]d weeks gestation. Thus far the pregnancy has been otherwise complicated by DI/DI twins,CHTN. The twins are dichorionic/diamniotic.  TWIN A GESTATION: PRESENTATION: Cephalic left FETAL ACTIVITY:          Heart rate         159          The fetus is active. AMNIOTIC FLUID: The amniotic fluid volume single deepest pocket is 4.8 cm. PLACENTA LOCALIZATION:  anterior GRADE 2 CERVIX: Limited view ADNEXA: wnl GESTATIONAL AGE AND  BIOMETRICS: Gestational criteria: Estimated Date of Delivery: 11/17/19 by LMP now at [redacted]w[redacted]d Previous Scans:5 BIOPHYSICAL PROFILE:                                                                                                      COMMENTS GROSS BODY MOVEMENT                 2  TONE                2  RESPIRATIONS                2  AMNIOTIC FLUID                2                                                          SCORE:  8/8 (Note: NST was not performed as part of this antepartum testing) DOPPLER FLOW STUDIES: .42,.46,.49,.54=9.6% ANATOMICAL SURVEY                                                                            COMMENTS CEREBRAL VENTRICLES yes normal  CHOROID PLEXUS yes normal                               4 CHAMBERED HEART yes normal  OUTFLOW TRACTS yes normal  DIAPHRAGM yes normal  STOMACH yes normal  RENAL REGION yes normal  BLADDER yes normal      3 VESSEL CORD yes normal              GENITALIA   female     SUSPECTED ABNORMALITIES:  no QUALITY OF SCAN: Satisfactory TWIN B GESTATION: PRESENTATION: Cephalic right FETAL ACTIVITY:          Heart rate         129          The fetus is active. AMNIOTIC FLUID: The amniotic fluid volume single deepest pocket is 3.9 cm. PLACENTA LOCALIZATION:  posterior GRADE 2 CERVIX: Limited view ADNEXA: wnl GESTATIONAL AGE AND  BIOMETRICS: Gestational criteria: Estimated Date of Delivery: 11/17/19 by LMP now at  [redacted]w[redacted]d Previous Scans:5 BIOPHYSICAL PROFILE:                                                                                                      COMMENTS GROSS BODY MOVEMENT                 2  TONE                2  RESPIRATIONS                2  AMNIOTIC FLUID                2                                                          SCORE:  8/8 (Note: NST was not performed as part of this antepartum testing) DOPPLER FLOW STUDIES: UMBILICAL ARTERY RI RATIOS:  .48,.55=10% ANATOMICAL SURVEY                                                                            COMMENTS CEREBRAL VENTRICLES yes normal  CHOROID PLEXUS yes normal                      NOSE/LIP yes normal  FACIAL PROFILE yes normal  4 CHAMBERED HEART yes normal  OUTFLOW TRACTS yes normal  DIAPHRAGM yes normal  STOMACH yes normal  RENAL REGION yes normal  BLADDER yes normal      3 VESSEL CORD yes normal              GENITALIA   female     SUSPECTED ABNORMALITIES:  no QUALITY OF SCAN: Satisfactory TECHNICIAN COMMENTS: [place] 33+6 wks DI/DI twins BABY A:cephalic left,anterior placenta gr 2,BPP 8/8,SVP of fluid 4.8 cm,fhr 159 bpm,RI .42,.46,.49,.54=9.6% BABY B:cephalic right,posterior placenta gr 2,BPP 8/8,SVP of fluid 3.9 cm,FHR 129 bpm,RI .48,.55=10% A copy of this report including all images has been  saved and backed up to a second source for retrieval if needed. All measures and details of the anatomical scan, placentation, fluid volume and pelvic anatomy are contained in that report. Amber Flora Lipps 10/05/2019 10:26 AM Clinical Impression and recommendations: I have reviewed the sonogram results above, combined with the patient's current clinical course, below are my impressions and any appropriate recommendations for management based on the sonographic findings. 1.  G1P0000 Estimated Date of Delivery: 11/17/19 by serial sonographic evaluations 2.  Fetal sonographic surveillance findings: DiDi twins a). Normal fluid volume x 2 b). Normal antepartum fetal assessment with BPP 8/8 x 2 c). Normal fetal  Doppler ratios with consistent diastolic flow x 2 3.  Normal general sonographic findings Recommend continued prenatal evaluations and care based on this sonogram and as clinically indicated from the patient's clinical course. Amaryllis Dyke Eure 10/05/2019 12:19 PM  US Fetal BPP W/O Non Stress  Result Date: 09/29/2019 TWINS FOLLOW UP SONOGRAM Lizzet Hendley is in the office for a follow up sonogram of a twin gestation. She is a 34 y.o. year old G1P0000 with Estimated Date of Delivery: 11/17/19 by LMP now at  [redacted]w[redacted]d weeks gestation. Thus far the pregnancy has been otherwise complicated by CHTN,DI/DI twins. The twins are dichorionic/diamniotic. TWIN A GESTATION: PRESENTATION: Cephalic left FETAL ACTIVITY:          Heart rate         161          The fetus is active. AMNIOTIC FLUID: The amniotic fluid volume single deepest pocket is 4.4 cm. PLACENTA LOCALIZATION:  anterior GRADE 2 CERVIX: Limited view ADNEXA: The ovaries are normal. Previous Scans:5 BIOPHYSICAL PROFILE:                                                                                                      COMMENTS GROSS BODY MOVEMENT                 2  TONE                2  RESPIRATIONS                2  AMNIOTIC FLUID                2                                                           SCORE:  8/8 (Note: NST was not performed as part of this antepartum testing) DOPPLER FLOW STUDIES: UMBILICAL ARTERY RI RATIOS:   .53,.52,.48=8.6% S/D 2.0=12.7% ANATOMICAL SURVEY                                                                            COMMENTS CEREBRAL VENTRICLES yes normal  CHOROID PLEXUS yes normal                          FACIAL PROFILE yes normal  4 CHAMBERED HEART yes normal  OUTFLOW TRACTS yes normal  DIAPHRAGM yes normal  STOMACH yes normal  RENAL REGION yes normal  BLADDER yes normal  CORD INSERTION yes normal  3 VESSEL CORD yes normal  GENITALIA   female     SUSPECTED ABNORMALITIES:  no QUALITY OF SCAN: Satisfactory TWIN B GESTATION: PRESENTATION: Cephalic right FETAL ACTIVITY:          Heart rate         169          The fetus is active. AMNIOTIC FLUID: The amniotic fluid volume single deepest pocket is 4.7 cm. PLACENTA LOCALIZATION:  posterior GRADE 2 CERVIX: Limited view ADNEXA: The ovaries are normal. Previous Scans:5 BIOPHYSICAL PROFILE:                                                                                                      COMMENTS GROSS BODY MOVEMENT                 2  TONE                2  RESPIRATIONS                2  AMNIOTIC FLUID                2                                                          SCORE:  8/8 (Note: NST was not performed as part of this antepartum testing) DOPPLER FLOW STUDIES: UMBILICAL ARTERY RI RATIOS:   .47,.58,.48,.57=21% S/D 2.2=21% ANATOMICAL SURVEY                                                                            COMMENTS CEREBRAL VENTRICLES yes normal  CHOROID PLEXUS yes normal                              4 CHAMBERED HEART yes normal  OUTFLOW TRACTS yes normal  DIAPHRAGM yes normal  STOMACH yes normal  RENAL REGION yes normal  BLADDER yes normal      3 VESSEL CORD yes normal              GENITALIA   female     SUSPECTED ABNORMALITIES:  no QUALITY OF SCAN: Satisfactory TECHNICIAN  COMMENTS: [place] 32+6 wks,DI/DI TWINS BABY A: cephalic left,anterior placenta gr 2,fhr 161 bpm,BPP 8/8,SVP of fluid 4.4 cm,RI .53,.52,.48=8.6% S/D 2.0=12.7% BABY B :cephalic right,posterior placenta gr 2,FHR 169 bpm,BPP 8/8,SVP of fluid 4.7 cm,RI .47,.58,.48,.57=21% S/D 2.2=21% A copy of this report including all images has been saved and backed up to a second source for retrieval if needed. All measures and details of the anatomical scan,  placentation, fluid volume and pelvic anatomy are contained in that report. Amber Flora LippsJ Carl 09/28/2019 9:55 AM Clinical Impression and recommendations: I have reviewed the sonogram results above.TWIN gestation Di/Di with stable growth Due to hypertension , currently mild GHTN, will Doppler for placental function. Dopplers quite reassuring at 12.7 and 21%ile. Fetal wellbeing suggested by reassuring dopplers, and symmetric fetal growth Combined with the patient's current clinical course, below are my impressions and any appropriate recommendations for management based on the sonographic findings: FINDINGS: Intrauterine gestational sac: TWIN Di/Di viable intrauterine pregnancy.                                       :  Visible Embryo: yes x2   Visible Cardiac Activity:  161, 169 Heart Rate: seen x 2                                                           Maternal uterus/adnexae: Ovaries are  within normal limits.                                                                       Right ovary seen                                                                       Left ovary     seen                                                                       Free Fluid  n/a    Twin Diamniotic Dichorionic viable intrauterine pregnancy as above, with symmetric growth .                                      Reassuring dopplers x 2.                                       Stable EDD as noted. Tilda BurrowJohn V Ferguson, MD Tilda BurrowJohn V Ferguson 09/29/2019   US Fetal BPP W/O Non Stress  Result Date:  09/29/2019 TWINS FOLLOW UP SONOGRAM Leota SauersKatherine Breece is in the office for a follow up sonogram of a twin gestation. She is a 34 y.o. year old G1P0000 with Estimated Date of Delivery: 11/17/19 by LMP now at  1388w6d weeks gestation.  Thus far the pregnancy has been otherwise complicated by CHTN,DI/DI twins. The twins are dichorionic/diamniotic. TWIN A GESTATION: PRESENTATION: Cephalic left FETAL ACTIVITY:          Heart rate         161          The fetus is active. AMNIOTIC FLUID: The amniotic fluid volume single deepest pocket is 4.4 cm. PLACENTA LOCALIZATION:  anterior GRADE 2 CERVIX: Limited view ADNEXA: The ovaries are normal. Previous Scans:5 BIOPHYSICAL PROFILE:                                                                                                      COMMENTS GROSS BODY MOVEMENT                 2  TONE                2  RESPIRATIONS                2  AMNIOTIC FLUID                2                                                          SCORE:  8/8 (Note: NST was not performed as part of this antepartum testing) DOPPLER FLOW STUDIES: UMBILICAL ARTERY RI RATIOS:   .53,.52,.48=8.6% S/D 2.0=12.7% ANATOMICAL SURVEY                                                                            COMMENTS CEREBRAL VENTRICLES yes normal  CHOROID PLEXUS yes normal                          FACIAL PROFILE yes normal  4 CHAMBERED HEART yes normal  OUTFLOW TRACTS yes normal  DIAPHRAGM yes normal  STOMACH yes normal  RENAL REGION yes normal  BLADDER yes normal  CORD INSERTION yes normal  3 VESSEL CORD yes normal              GENITALIA   female     SUSPECTED ABNORMALITIES:  no QUALITY OF SCAN: Satisfactory TWIN B GESTATION: PRESENTATION: Cephalic right FETAL ACTIVITY:          Heart rate         169          The fetus is active. AMNIOTIC FLUID: The amniotic fluid volume single deepest pocket is 4.7 cm. PLACENTA LOCALIZATION:  posterior GRADE 2 CERVIX: Limited view ADNEXA: The ovaries are normal. Previous Scans:5  BIOPHYSICAL PROFILE:                                                                                                      COMMENTS GROSS BODY MOVEMENT                 2  TONE                2  RESPIRATIONS                2  AMNIOTIC FLUID                2                                                          SCORE:  8/8 (Note: NST was not performed as part of this antepartum testing) DOPPLER FLOW STUDIES: UMBILICAL ARTERY RI RATIOS:   .47,.58,.48,.57=21% S/D 2.2=21% ANATOMICAL SURVEY                                                                            COMMENTS CEREBRAL VENTRICLES yes normal  CHOROID PLEXUS yes normal                              4 CHAMBERED HEART yes normal  OUTFLOW TRACTS yes normal  DIAPHRAGM yes normal  STOMACH yes normal  RENAL REGION yes normal  BLADDER yes normal      3 VESSEL CORD yes normal              GENITALIA   female     SUSPECTED ABNORMALITIES:  no QUALITY OF SCAN: Satisfactory TECHNICIAN COMMENTS: [place] 32+6 wks,DI/DI TWINS BABY A: cephalic left,anterior placenta gr 2,fhr 161 bpm,BPP 8/8,SVP of fluid 4.4 cm,RI .53,.52,.48=8.6% S/D 2.0=12.7% BABY B :cephalic right,posterior placenta gr 2,FHR 169 bpm,BPP 8/8,SVP of fluid 4.7 cm,RI .47,.58,.48,.57=21% S/D 2.2=21% A copy of this report including all images has been saved and backed up to a second source for retrieval if needed. All measures and details of the anatomical scan, placentation, fluid volume and pelvic anatomy are contained in that report. Amber Flora Lipps 09/28/2019 9:55 AM Clinical Impression and recommendations: I have reviewed the sonogram results above.TWIN gestation Di/Di with stable growth Due to hypertension , currently mild GHTN, will Doppler for placental function. Dopplers quite reassuring at 12.7 and 21%ile. Fetal wellbeing suggested by reassuring dopplers, and symmetric fetal growth Combined with the patient's current clinical course, below are my impressions and any appropriate recommendations for management based  on the sonographic findings: FINDINGS:  Intrauterine gestational sac: TWIN Di/Di viable intrauterine pregnancy.                                       :  Visible Embryo: yes x2   Visible Cardiac Activity:  161, 169 Heart Rate: seen x 2                                                           Maternal uterus/adnexae: Ovaries are  within normal limits.                                                                       Right ovary seen                                                                       Left ovary     seen                                                                       Free Fluid  n/a    Twin Diamniotic Dichorionic viable intrauterine pregnancy as above, with symmetric growth .                                      Reassuring dopplers x 2.                                       Stable EDD as noted. Tilda Burrow, MD Tilda Burrow 09/29/2019   US OB Follow Up AddL Gest  Result Date: 09/12/2019 TWINS FOLLOW UP SONOGRAM Myya Meenach is in the office for a follow up sonogram of a twin gestation. She is a 34 y.o. year old G1P0000 with Estimated Date of Delivery: 11/17/19 by LMP now at  [redacted]w[redacted]d weeks gestation. Thus far the pregnancy has been otherwise complicated by DI/DI twin. The twins are dichorionic/diamniotic. TWIN A GESTATION: PRESENTATION: Cephalic left FETAL ACTIVITY:          Heart rate         140          The fetus is active. AMNIOTIC FLUID: The amniotic fluid volume single deepest pocket is 4.3 cm. PLACENTA LOCALIZATION:  anterior GRADE 1 CERVIX: Measures 3.7 cm GESTATIONAL AGE AND  BIOMETRICS: Gestational criteria: Estimated  Date of Delivery: 11/17/19 by LMP now at [redacted]w[redacted]d Previous Scans:5          BIPARIETAL DIAMETER           8.3 cm         33+3 weeks  98% HEAD CIRCUMFERENCE           30.03 cm         33+2 weeks ABDOMINAL CIRCUMFERENCE           27.16 cm         31+1 weeks FEMUR LENGTH           5.88 cm         30+4 weeks                                                        AVERAGE EGA(BY THIS SCAN):  32 weeks                                                 ESTIMATED FETAL WEIGHT:       1772  grams, 68 % ANATOMICAL SURVEY                                                                            COMMENTS CEREBRAL VENTRICLES yes normal  CHOROID PLEXUS yes normal  CEREBELLUM yes normal  CISTERNA MAGNA yes normal      ORBITS yes normal          FACIAL PROFILE yes normal  4 CHAMBERED HEART yes normal  OUTFLOW TRACTS yes normal  DIAPHRAGM yes normal  STOMACH yes normal  RENAL REGION yes normal  BLADDER yes normal      3 VESSEL CORD yes normal              GENITALIA yes normal female     SUSPECTED ABNORMALITIES:  no QUALITY OF SCAN: Satisfactory TWIN B GESTATION: PRESENTATION: Cephalic right superior FETAL ACTIVITY:          Heart rate         171          The fetus is active. AMNIOTIC FLUID: The amniotic fluid volume single deepest pocket is 5.2 cm. PLACENTA LOCALIZATION:  posterior GRADE 1 CERVIX: Measures 3.7 cm GESTATIONAL AGE AND  BIOMETRICS: Gestational criteria: Estimated Date of Delivery: 11/17/19 by LMP now at [redacted]w[redacted]d Previous Scans:5          BIPARIETAL DIAMETER           7.94 cm         31+6 weeks HEAD CIRCUMFERENCE           29.57 cm         32+4 weeks ABDOMINAL CIRCUMFERENCE           29.43 cm         33+2 weeks   98% FEMUR LENGTH  5.85 cm         30+3 weeks                                                       AVERAGE EGA(BY THIS SCAN):  32 weeks                                                 ESTIMATED FETAL WEIGHT:       1962  grams, 91 % ANATOMICAL SURVEY                                                                            COMMENTS CEREBRAL VENTRICLES yes normal  CHOROID PLEXUS yes normal  CEREBELLUM yes normal                      FACIAL PROFILE yes normal  4 CHAMBERED HEART yes normal  OUTFLOW TRACTS yes normal  DIAPHRAGM yes normal  STOMACH yes normal  RENAL REGION yes normal  BLADDER yes normal      3 VESSEL CORD yes normal              GENITALIA yes normal  female     SUSPECTED ABNORMALITIES:  no QUALITY OF SCAN: Satisfactory The current discrepancy in  fetal weight betwen the twins is 9.7% which  is not clinically relevant. TECHNICIAN COMMENTS: [place] 30+4 wks,DI/DI twins,cx 3.7 cm BABY A: cephalic left,female,anterior placenta gr 1,fhr 140 bpm,svp of fluid 4.7 cm,EFW 1772 g 68%,discordance 9.7% BABY B: cephalic right superior,female,posterior placenta gr 1,fhr 171 bpm,svp of fluid 5.2 cm,EFW 1962 g 91%,AC 98% A copy of this report including all images has been saved and backed up to a second source for retrieval if needed. All measures and details of the anatomical scan, placentation, fluid volume and pelvic anatomy are contained in that report. Amber Flora Lipps 09/12/2019 10:04 AM Clinical Impression and recommendations: I have reviewed the sonogram results above, combined with the patient's current clinical course, below are my impressions and any appropriate recommendations for management based on the sonographic findings. 1.  G1P0000 Estimated Date of Delivery: 11/17/19 by serial sonographic evaluations 2.  Fetal sonographic surveillance findings: DiDi twins: cephalic/cephalic a). Normal fluid volume b). Normal growth percentile with appropriate interval growth, concordant growth 3.  Normal general sonographic findings Recommend continued prenatal evaluations and care based on this sonogram and as clinically indicated from the patient's clinical course. Amaryllis Dyke Eure 09/12/2019 10:30 AM  [place] UA Addl Gest  Result Date: 10/05/2019 TWINS FOLLOW UP SONOGRAM Audrinna Sherman is in the office for a follow up sonogram of a twin gestation. She is a 34 y.o. year old G1P0000 with Estimated Date of Delivery: 11/17/19 by LMP now at  [redacted]w[redacted]d weeks gestation. Thus far the pregnancy has been otherwise complicated by DI/DI twins,CHTN. The twins are dichorionic/diamniotic. TWIN A GESTATION: PRESENTATION: Cephalic left FETAL ACTIVITY:  Heart rate         159          The fetus is active.  AMNIOTIC FLUID: The amniotic fluid volume single deepest pocket is 4.8 cm. PLACENTA LOCALIZATION:  anterior GRADE 2 CERVIX: Limited view ADNEXA: wnl GESTATIONAL AGE AND  BIOMETRICS: Gestational criteria: Estimated Date of Delivery: 11/17/19 by LMP now at 7316w6d Previous Scans:5 BIOPHYSICAL PROFILE:                                                                                                      COMMENTS GROSS BODY MOVEMENT                 2  TONE                2  RESPIRATIONS                2  AMNIOTIC FLUID                2                                                          SCORE:  8/8 (Note: NST was not performed as part of this antepartum testing) DOPPLER FLOW STUDIES: .42,.46,.49,.54=9.6% ANATOMICAL SURVEY                                                                            COMMENTS CEREBRAL VENTRICLES yes normal  CHOROID PLEXUS yes normal                              4 CHAMBERED HEART yes normal  OUTFLOW TRACTS yes normal  DIAPHRAGM yes normal  STOMACH yes normal  RENAL REGION yes normal  BLADDER yes normal      3 VESSEL CORD yes normal              GENITALIA   female     SUSPECTED ABNORMALITIES:  no QUALITY OF SCAN: Satisfactory TWIN B GESTATION: PRESENTATION: Cephalic right FETAL ACTIVITY:          Heart rate         129          The fetus is active. AMNIOTIC FLUID: The amniotic fluid volume single deepest pocket is 3.9 cm. PLACENTA LOCALIZATION:  posterior GRADE 2 CERVIX: Limited view ADNEXA: wnl GESTATIONAL AGE AND  BIOMETRICS: Gestational criteria: Estimated Date of Delivery: 11/17/19 by LMP now at 5416w6d Previous Scans:5 BIOPHYSICAL PROFILE:  COMMENTS GROSS BODY MOVEMENT                 2  TONE                2  RESPIRATIONS                2  AMNIOTIC FLUID                2                                                          SCORE:  8/8 (Note: NST was not performed as part of this antepartum  testing) DOPPLER FLOW STUDIES: UMBILICAL ARTERY RI RATIOS:  .48,.55=10% ANATOMICAL SURVEY                                                                            COMMENTS CEREBRAL VENTRICLES yes normal  CHOROID PLEXUS yes normal                      NOSE/LIP yes normal  FACIAL PROFILE yes normal  4 CHAMBERED HEART yes normal  OUTFLOW TRACTS yes normal  DIAPHRAGM yes normal  STOMACH yes normal  RENAL REGION yes normal  BLADDER yes normal      3 VESSEL CORD yes normal              GENITALIA   female     SUSPECTED ABNORMALITIES:  no QUALITY OF SCAN: Satisfactory TECHNICIAN COMMENTS: [place] 33+6 wks DI/DI twins BABY A:cephalic left,anterior placenta gr 2,BPP 8/8,SVP of fluid 4.8 cm,fhr 159 bpm,RI .42,.46,.49,.54=9.6% BABY B:cephalic right,posterior placenta gr 2,BPP 8/8,SVP of fluid 3.9 cm,FHR 129 bpm,RI .48,.55=10% A copy of this report including all images has been saved and backed up to a second source for retrieval if needed. All measures and details of the anatomical scan, placentation, fluid volume and pelvic anatomy are contained in that report. Amber Flora Lipps 10/05/2019 10:26 AM Clinical Impression and recommendations: I have reviewed the sonogram results above, combined with the patient's current clinical course, below are my impressions and any appropriate recommendations for management based on the sonographic findings. 1.  G1P0000 Estimated Date of Delivery: 11/17/19 by serial sonographic evaluations 2.  Fetal sonographic surveillance findings: DiDi twins a). Normal fluid volume x 2 b). Normal antepartum fetal assessment with BPP 8/8 x 2 c). Normal fetal Doppler ratios with consistent diastolic flow x 2 3.  Normal general sonographic findings Recommend continued prenatal evaluations and care based on this sonogram and as clinically indicated from the patient's clinical course. Lazaro Arms 10/05/2019 12:19 PM  [place] UA Addl Gest  Result Date: 09/29/2019 TWINS FOLLOW UP SONOGRAM Kayzlee Wirtanen is in the office for  a follow up sonogram of a twin gestation. She is a 34 y.o. year old G1P0000 with Estimated Date of Delivery: 11/17/19 by LMP now at  [redacted]w[redacted]d weeks gestation. Thus far the pregnancy has been otherwise complicated by CHTN,DI/DI twins. The twins are dichorionic/diamniotic. TWIN  A GESTATION: PRESENTATION: Cephalic left FETAL ACTIVITY:          Heart rate         161          The fetus is active. AMNIOTIC FLUID: The amniotic fluid volume single deepest pocket is 4.4 cm. PLACENTA LOCALIZATION:  anterior GRADE 2 CERVIX: Limited view ADNEXA: The ovaries are normal. Previous Scans:5 BIOPHYSICAL PROFILE:                                                                                                      COMMENTS GROSS BODY MOVEMENT                 2  TONE                2  RESPIRATIONS                2  AMNIOTIC FLUID                2                                                          SCORE:  8/8 (Note: NST was not performed as part of this antepartum testing) DOPPLER FLOW STUDIES: UMBILICAL ARTERY RI RATIOS:   .53,.52,.48=8.6% S/D 2.0=12.7% ANATOMICAL SURVEY                                                                            COMMENTS CEREBRAL VENTRICLES yes normal  CHOROID PLEXUS yes normal                          FACIAL PROFILE yes normal  4 CHAMBERED HEART yes normal  OUTFLOW TRACTS yes normal  DIAPHRAGM yes normal  STOMACH yes normal  RENAL REGION yes normal  BLADDER yes normal  CORD INSERTION yes normal  3 VESSEL CORD yes normal              GENITALIA   female     SUSPECTED ABNORMALITIES:  no QUALITY OF SCAN: Satisfactory TWIN B GESTATION: PRESENTATION: Cephalic right FETAL ACTIVITY:          Heart rate         169          The fetus is active. AMNIOTIC FLUID: The amniotic fluid volume single deepest pocket is 4.7 cm. PLACENTA LOCALIZATION:  posterior GRADE 2 CERVIX: Limited view ADNEXA: The ovaries are normal. Previous Scans:5 BIOPHYSICAL PROFILE:  COMMENTS GROSS BODY MOVEMENT                 2  TONE                2  RESPIRATIONS                2  AMNIOTIC FLUID                2                                                          SCORE:  8/8 (Note: NST was not performed as part of this antepartum testing) DOPPLER FLOW STUDIES: UMBILICAL ARTERY RI RATIOS:   .47,.58,.48,.57=21% S/D 2.2=21% ANATOMICAL SURVEY                                                                            COMMENTS CEREBRAL VENTRICLES yes normal  CHOROID PLEXUS yes normal                              4 CHAMBERED HEART yes normal  OUTFLOW TRACTS yes normal  DIAPHRAGM yes normal  STOMACH yes normal  RENAL REGION yes normal  BLADDER yes normal      3 VESSEL CORD yes normal              GENITALIA   female     SUSPECTED ABNORMALITIES:  no QUALITY OF SCAN: Satisfactory TECHNICIAN COMMENTS: [place] 32+6 wks,DI/DI TWINS BABY A: cephalic left,anterior placenta gr 2,fhr 161 bpm,BPP 8/8,SVP of fluid 4.4 cm,RI .53,.52,.48=8.6% S/D 2.0=12.7% BABY B :cephalic right,posterior placenta gr 2,FHR 169 bpm,BPP 8/8,SVP of fluid 4.7 cm,RI .47,.58,.48,.57=21% S/D 2.2=21% A copy of this report including all images has been saved and backed up to a second source for retrieval if needed. All measures and details of the anatomical scan, placentation, fluid volume and pelvic anatomy are contained in that report. Amber Flora Lipps 09/28/2019 9:55 AM Clinical Impression and recommendations: I have reviewed the sonogram results above.TWIN gestation Di/Di with stable growth Due to hypertension , currently mild GHTN, will Doppler for placental function. Dopplers quite reassuring at 12.7 and 21%ile. Fetal wellbeing suggested by reassuring dopplers, and symmetric fetal growth Combined with the patient's current clinical course, below are my impressions and any appropriate recommendations for management based on the sonographic findings: FINDINGS: Intrauterine gestational sac: TWIN Di/Di  viable intrauterine pregnancy.                                       :  Visible Embryo: yes x2   Visible Cardiac Activity:  161, 169 Heart Rate: seen x 2  Maternal uterus/adnexae: Ovaries are  within normal limits.                                                                       Right ovary seen                                                                       Left ovary     seen                                                                       Free Fluid  n/a    Twin Diamniotic Dichorionic viable intrauterine pregnancy as above, with symmetric growth .                                      Reassuring dopplers x 2.                                       Stable EDD as noted. Tilda Burrow, MD Tilda Burrow 09/29/2019   [place] UA Cord Doppler  Result Date: 10/05/2019 TWINS FOLLOW UP SONOGRAM Kally Cadden is in the office for a follow up sonogram of a twin gestation. She is a 34 y.o. year old G1P0000 with Estimated Date of Delivery: 11/17/19 by LMP now at  [redacted]w[redacted]d weeks gestation. Thus far the pregnancy has been otherwise complicated by DI/DI twins,CHTN. The twins are dichorionic/diamniotic. TWIN A GESTATION: PRESENTATION: Cephalic left FETAL ACTIVITY:          Heart rate         159          The fetus is active. AMNIOTIC FLUID: The amniotic fluid volume single deepest pocket is 4.8 cm. PLACENTA LOCALIZATION:  anterior GRADE 2 CERVIX: Limited view ADNEXA: wnl GESTATIONAL AGE AND  BIOMETRICS: Gestational criteria: Estimated Date of Delivery: 11/17/19 by LMP now at [redacted]w[redacted]d Previous Scans:5 BIOPHYSICAL PROFILE:                                                                                                      COMMENTS GROSS BODY MOVEMENT  2  TONE                2  RESPIRATIONS                2  AMNIOTIC FLUID                2                                                          SCORE:  8/8 (Note: NST was not performed as part of this  antepartum testing) DOPPLER FLOW STUDIES: .42,.46,.49,.54=9.6% ANATOMICAL SURVEY                                                                            COMMENTS CEREBRAL VENTRICLES yes normal  CHOROID PLEXUS yes normal                              4 CHAMBERED HEART yes normal  OUTFLOW TRACTS yes normal  DIAPHRAGM yes normal  STOMACH yes normal  RENAL REGION yes normal  BLADDER yes normal      3 VESSEL CORD yes normal              GENITALIA   female     SUSPECTED ABNORMALITIES:  no QUALITY OF SCAN: Satisfactory TWIN B GESTATION: PRESENTATION: Cephalic right FETAL ACTIVITY:          Heart rate         129          The fetus is active. AMNIOTIC FLUID: The amniotic fluid volume single deepest pocket is 3.9 cm. PLACENTA LOCALIZATION:  posterior GRADE 2 CERVIX: Limited view ADNEXA: wnl GESTATIONAL AGE AND  BIOMETRICS: Gestational criteria: Estimated Date of Delivery: 11/17/19 by LMP now at [redacted]w[redacted]d Previous Scans:5 BIOPHYSICAL PROFILE:                                                                                                      COMMENTS GROSS BODY MOVEMENT                 2  TONE                2  RESPIRATIONS                2  AMNIOTIC FLUID                2  SCORE:  8/8 (Note: NST was not performed as part of this antepartum testing) DOPPLER FLOW STUDIES: UMBILICAL ARTERY RI RATIOS:  .48,.55=10% ANATOMICAL SURVEY                                                                            COMMENTS CEREBRAL VENTRICLES yes normal  CHOROID PLEXUS yes normal                      NOSE/LIP yes normal  FACIAL PROFILE yes normal  4 CHAMBERED HEART yes normal  OUTFLOW TRACTS yes normal  DIAPHRAGM yes normal  STOMACH yes normal  RENAL REGION yes normal  BLADDER yes normal      3 VESSEL CORD yes normal              GENITALIA   female     SUSPECTED ABNORMALITIES:  no QUALITY OF SCAN: Satisfactory TECHNICIAN COMMENTS: [place] 33+6 wks DI/DI twins BABY A:cephalic left,anterior  placenta gr 2,BPP 8/8,SVP of fluid 4.8 cm,fhr 159 bpm,RI .42,.46,.49,.54=9.6% BABY B:cephalic right,posterior placenta gr 2,BPP 8/8,SVP of fluid 3.9 cm,FHR 129 bpm,RI .48,.55=10% A copy of this report including all images has been saved and backed up to a second source for retrieval if needed. All measures and details of the anatomical scan, placentation, fluid volume and pelvic anatomy are contained in that report. Amber Flora Lipps 10/05/2019 10:26 AM Clinical Impression and recommendations: I have reviewed the sonogram results above, combined with the patient's current clinical course, below are my impressions and any appropriate recommendations for management based on the sonographic findings. 1.  G1P0000 Estimated Date of Delivery: 11/17/19 by serial sonographic evaluations 2.  Fetal sonographic surveillance findings: DiDi twins a). Normal fluid volume x 2 b). Normal antepartum fetal assessment with BPP 8/8 x 2 c). Normal fetal Doppler ratios with consistent diastolic flow x 2 3.  Normal general sonographic findings Recommend continued prenatal evaluations and care based on this sonogram and as clinically indicated from the patient's clinical course. Amaryllis Dyke Eure 10/05/2019 12:19 PM  [place] UA Cord Doppler  Result Date: 09/29/2019 TWINS FOLLOW UP SONOGRAM Stephanie Littman is in the office for a follow up sonogram of a twin gestation. She is a 34 y.o. year old G1P0000 with Estimated Date of Delivery: 11/17/19 by LMP now at  [redacted]w[redacted]d weeks gestation. Thus far the pregnancy has been otherwise complicated by CHTN,DI/DI twins. The twins are dichorionic/diamniotic. TWIN A GESTATION: PRESENTATION: Cephalic left FETAL ACTIVITY:          Heart rate         161          The fetus is active. AMNIOTIC FLUID: The amniotic fluid volume single deepest pocket is 4.4 cm. PLACENTA LOCALIZATION:  anterior GRADE 2 CERVIX: Limited view ADNEXA: The ovaries are normal. Previous Scans:5 BIOPHYSICAL PROFILE:  COMMENTS GROSS BODY MOVEMENT                 2  TONE                2  RESPIRATIONS                2  AMNIOTIC FLUID                2                                                          SCORE:  8/8 (Note: NST was not performed as part of this antepartum testing) DOPPLER FLOW STUDIES: UMBILICAL ARTERY RI RATIOS:   .53,.52,.48=8.6% S/D 2.0=12.7% ANATOMICAL SURVEY                                                                            COMMENTS CEREBRAL VENTRICLES yes normal  CHOROID PLEXUS yes normal                          FACIAL PROFILE yes normal  4 CHAMBERED HEART yes normal  OUTFLOW TRACTS yes normal  DIAPHRAGM yes normal  STOMACH yes normal  RENAL REGION yes normal  BLADDER yes normal  CORD INSERTION yes normal  3 VESSEL CORD yes normal              GENITALIA   female     SUSPECTED ABNORMALITIES:  no QUALITY OF SCAN: Satisfactory TWIN B GESTATION: PRESENTATION: Cephalic right FETAL ACTIVITY:          Heart rate         169          The fetus is active. AMNIOTIC FLUID: The amniotic fluid volume single deepest pocket is 4.7 cm. PLACENTA LOCALIZATION:  posterior GRADE 2 CERVIX: Limited view ADNEXA: The ovaries are normal. Previous Scans:5 BIOPHYSICAL PROFILE:                                                                                                      COMMENTS GROSS BODY MOVEMENT                 2  TONE                2  RESPIRATIONS                2  AMNIOTIC FLUID                2  SCORE:  8/8 (Note: NST was not performed as part of this antepartum testing) DOPPLER FLOW STUDIES: UMBILICAL ARTERY RI RATIOS:   .47,.58,.48,.57=21% S/D 2.2=21% ANATOMICAL SURVEY                                                                            COMMENTS CEREBRAL VENTRICLES yes normal  CHOROID PLEXUS yes normal                              4 CHAMBERED HEART yes normal  OUTFLOW TRACTS yes normal   DIAPHRAGM yes normal  STOMACH yes normal  RENAL REGION yes normal  BLADDER yes normal      3 VESSEL CORD yes normal              GENITALIA   female     SUSPECTED ABNORMALITIES:  no QUALITY OF SCAN: Satisfactory TECHNICIAN COMMENTS: [place] 32+6 wks,DI/DI TWINS BABY A: cephalic left,anterior placenta gr 2,fhr 161 bpm,BPP 8/8,SVP of fluid 4.4 cm,RI .53,.52,.48=8.6% S/D 2.0=12.7% BABY B :cephalic right,posterior placenta gr 2,FHR 169 bpm,BPP 8/8,SVP of fluid 4.7 cm,RI .47,.58,.48,.57=21% S/D 2.2=21% A copy of this report including all images has been saved and backed up to a second source for retrieval if needed. All measures and details of the anatomical scan, placentation, fluid volume and pelvic anatomy are contained in that report. Amber Flora Lipps 09/28/2019 9:55 AM Clinical Impression and recommendations: I have reviewed the sonogram results above.TWIN gestation Di/Di with stable growth Due to hypertension , currently mild GHTN, will Doppler for placental function. Dopplers quite reassuring at 12.7 and 21%ile. Fetal wellbeing suggested by reassuring dopplers, and symmetric fetal growth Combined with the patient's current clinical course, below are my impressions and any appropriate recommendations for management based on the sonographic findings: FINDINGS: Intrauterine gestational sac: TWIN Di/Di viable intrauterine pregnancy.                                       :  Visible Embryo: yes x2   Visible Cardiac Activity:  161, 169 Heart Rate: seen x 2                                                           Maternal uterus/adnexae: Ovaries are  within normal limits.                                                                       Right ovary seen  Left ovary     seen                                                                       Free Fluid  n/a    Twin Diamniotic Dichorionic viable intrauterine pregnancy as above, with symmetric growth .                                       Reassuring dopplers x 2.                                       Stable EDD as noted. Tilda Burrow, MD Tilda Burrow 09/29/2019     Assessment and Plan: Patient Active Problem List   Diagnosis Date Noted  . Preeclampsia, third trimester 10/11/2019  . PTSD (post-traumatic stress disorder) 10/10/2019  . Rubella non-immune status, antepartum 05/16/2019  . Supervision of high risk pregnancy, antepartum 05/09/2019  . Dichorionic diamniotic twin pregnancy 05/09/2019  . Infertility, female 10/26/2017  . Dyspepsia 03/19/2017  . Dysphagia 01/06/2017  . GAD (generalized anxiety disorder) 12/10/2016  . Acid reflux 12/10/2016  . Overweight 12/10/2016   Admit to Antenatal Routine antenatal care. Repeat  Labs ordered and BPP tomorrow  Scheryl Darter, MD Attending physician Faculty Practice, [facility]  10/11/2019 3:32 PM

## 2019-10-12 ENCOUNTER — Other Ambulatory Visit: Payer: PRIVATE HEALTH INSURANCE

## 2019-10-12 ENCOUNTER — Encounter: Payer: PRIVATE HEALTH INSURANCE | Admitting: Family Medicine

## 2019-10-12 ENCOUNTER — Inpatient Hospital Stay (HOSPITAL_COMMUNITY): Payer: 59

## 2019-10-12 DIAGNOSIS — O30043 Twin pregnancy, dichorionic/diamniotic, third trimester: Secondary | ICD-10-CM

## 2019-10-12 DIAGNOSIS — O1493 Unspecified pre-eclampsia, third trimester: Secondary | ICD-10-CM

## 2019-10-12 DIAGNOSIS — O1490 Unspecified pre-eclampsia, unspecified trimester: Secondary | ICD-10-CM

## 2019-10-12 DIAGNOSIS — Z3A34 34 weeks gestation of pregnancy: Secondary | ICD-10-CM

## 2019-10-12 DIAGNOSIS — Z363 Encounter for antenatal screening for malformations: Secondary | ICD-10-CM

## 2019-10-12 LAB — COMPREHENSIVE METABOLIC PANEL
ALT: 41 U/L (ref 0–44)
AST: 42 U/L — ABNORMAL HIGH (ref 15–41)
Albumin: 1.8 g/dL — ABNORMAL LOW (ref 3.5–5.0)
Alkaline Phosphatase: 148 U/L — ABNORMAL HIGH (ref 38–126)
Anion gap: 15 (ref 5–15)
BUN: 9 mg/dL (ref 6–20)
CO2: 20 mmol/L — ABNORMAL LOW (ref 22–32)
Calcium: 9.3 mg/dL (ref 8.9–10.3)
Chloride: 105 mmol/L (ref 98–111)
Creatinine, Ser: 0.91 mg/dL (ref 0.44–1.00)
GFR calc Af Amer: >60 mL/min (ref >60)
GFR calc non Af Amer: >60 mL/min (ref >60)
Glucose, Bld: 94 mg/dL (ref 70–99)
Potassium: 3.7 mmol/L (ref 3.5–5.1)
Sodium: 140 mmol/L (ref 135–145)
Total Bilirubin: 0.5 mg/dL (ref 0.3–1.2)
Total Protein: 4.9 g/dL — ABNORMAL LOW (ref 6.5–8.1)

## 2019-10-12 LAB — CBC
HCT: 29.7 % — ABNORMAL LOW (ref 36.0–46.0)
Hemoglobin: 9.2 g/dL — ABNORMAL LOW (ref 12.0–15.0)
MCH: 24.8 pg — ABNORMAL LOW (ref 26.0–34.0)
MCHC: 31 g/dL (ref 30.0–36.0)
MCV: 80.1 fL (ref 80.0–100.0)
Platelets: 178 10*3/uL (ref 150–400)
RBC: 3.71 MIL/uL — ABNORMAL LOW (ref 3.87–5.11)
RDW: 15.4 % (ref 11.5–15.5)
WBC: 10.9 10*3/uL — ABNORMAL HIGH (ref 4.0–10.5)
nRBC: 0.6 % — ABNORMAL HIGH (ref 0.0–0.2)

## 2019-10-12 MED ORDER — FAMOTIDINE 20 MG PO TABS
40.0000 mg | ORAL_TABLET | Freq: Every day | ORAL | Status: DC
  Administered 2019-10-12 – 2019-10-13 (×2): 40 mg via ORAL
  Filled 2019-10-12 (×2): qty 2

## 2019-10-12 NOTE — Progress Notes (Signed)
Patient ID: Rachel Roy, female   DOB: 19-Dec-1985, 34 y.o.   MRN: [id number] FACULTY PRACTICE ANTEPARTUM(COMPREHENSIVE) NOTE  Rachel Roy is a 34 y.o. G1P0000 at [redacted]w[redacted]d by early ultrasound who is admitted for preeclampsia with twin pregnancy.   Fetal presentation is cephalic. Length of Stay:  1  Days  Subjective: No headache or scotomata Patient reports the fetal movement as decreased . Patient reports uterine contraction  activity as none. Patient reports  vaginal bleeding as none. Patient describes fluid per vagina as None.  Vitals:  Blood pressure (!) 141/87, pulse 64, temperature 97.7 F (36.5 C), temperature source Oral, resp. rate 18, height 5\' 7"  (1.702 m), weight 116.5 kg, last menstrual period 02/10/2019, SpO2 98 %. Physical Examination:  General appearance - alert, well appearing, and in no distress Heart - normal rate and regular rhythm Abdomen - soft, nontender, nondistended Fundal Height:  consistent with twins Cervical Exam: Not evaluated.  Extremities: extremities normal, atraumatic, no cyanosis or edema and Homans sign is negative, no sign of DVT Membranes:intact  Fetal Monitoring:   Fetal Heart Rate A  Mode External filed at 10/12/2019 0840  Baseline Rate (A) 145 bpm filed at 10/12/2019 0840  Variability 6-25 BPM filed at 10/12/2019 0840  Accelerations 15 x 15 filed at 10/12/2019 0840  Decelerations None filed at 10/12/2019 0840  Fetal Heart Rate Fetus B  Mode External filed at 10/12/2019 0840  Baseline Rate (B) 140 BPM filed at 10/12/2019 0840  Variability <5 BPM, 6-25 BPM filed at 10/12/2019 0840  Accelerations 15 x 15 filed at 10/12/2019 0840  Decelerations None filed at 10/12/2019 0840     Labs:  Results for orders placed or performed during the hospital encounter of 10/11/19 (from the past 24 hour(s))  SARS CORONAVIRUS 2 (TAT 6-24 HRS) Nasopharyngeal Nasopharyngeal Swab   Collection Time: 10/11/19  2:09 PM   Specimen: Nasopharyngeal Swab   Result Value Ref Range   SARS Coronavirus 2 NEGATIVE NEGATIVE  Comprehensive metabolic panel   Collection Time: 10/11/19  3:17 PM  Result Value Ref Range   Sodium 137 135 - 145 mmol/L   Potassium 3.6 3.5 - 5.1 mmol/L   Chloride 108 98 - 111 mmol/L   CO2 20 (L) 22 - 32 mmol/L   Glucose, Bld 122 (H) 70 - 99 mg/dL   BUN 9 6 - 20 mg/dL   Creatinine, Ser 10/13/19 (H) 0.44 - 1.00 mg/dL   Calcium 8.5 (L) 8.9 - 10.3 mg/dL   Total Protein 5.2 (L) 6.5 - 8.1 g/dL   Albumin 2.1 (L) 3.5 - 5.0 g/dL   AST 32 15 - 41 U/L   ALT 26 0 - 44 U/L   Alkaline Phosphatase 159 (H) 38 - 126 U/L   Total Bilirubin 0.2 (L) 0.3 - 1.2 mg/dL   GFR calc non Af Amer >60 >60 mL/min   GFR calc Af Amer >60 >60 mL/min   Anion gap 9 5 - 15  CBC on admission   Collection Time: 10/11/19  3:17 PM  Result Value Ref Range   WBC 11.0 (H) 4.0 - 10.5 K/uL   RBC 4.07 3.87 - 5.11 MIL/uL   Hemoglobin 10.1 (L) 12.0 - 15.0 g/dL   HCT 10/13/19 (L) 16.0 - 73.7 %   MCV 79.1 (L) 80.0 - 100.0 fL   MCH 24.8 (L) 26.0 - 34.0 pg   MCHC 31.4 30.0 - 36.0 g/dL   RDW 10.6 26.9 - 48.5 %   Platelets 216 150 - 400 K/uL  nRBC 0.5 (H) 0.0 - 0.2 %  Type and screen [place]   Collection Time: 10/11/19  3:17 PM  Result Value Ref Range   ABO/RH(D) AB POS    Antibody Screen NEG    Sample Expiration      10/14/2019,2359 Performed at [facility], [place] [address], [place], [place] [address]   ABO/Rh   Collection Time: 10/11/19  3:17 PM  Result Value Ref Range   ABO/RH(D)      AB POS Performed at [facility] [address], [place], [place] [address]   Culture, beta strep (group b only)   Collection Time: 10/11/19  3:24 PM   Specimen: Vaginal/Rectal; Genital  Result Value Ref Range   Specimen Description VAGINAL/RECTAL    Special Requests NONE    Culture      CULTURE REINCUBATED FOR BETTER GROWTH Performed at [facility], [place] [address], [place], [place] [address]    Report Status PENDING   Protein  / creatinine ratio, urine   Collection Time: 10/11/19  4:44 PM  Result Value Ref Range   Creatinine, Urine 38.10 mg/dL   Total Protein, Urine 9 mg/dL   Protein Creatinine Ratio 0.24 (H) 0.00 - 0.15 mg/mg[Cre]  CBC   Collection Time: 10/12/19  5:06 AM  Result Value Ref Range   WBC 10.9 (H) 4.0 - 10.5 K/uL   RBC 3.71 (L) 3.87 - 5.11 MIL/uL   Hemoglobin 9.2 (L) 12.0 - 15.0 g/dL   HCT 29.7 (L) 36.0 - 46.0 %   MCV 80.1 80.0 - 100.0 fL   MCH 24.8 (L) 26.0 - 34.0 pg   MCHC 31.0 30.0 - 36.0 g/dL   RDW 15.4 11.5 - 15.5 %   Platelets 178 150 - 400 K/uL   nRBC 0.6 (H) 0.0 - 0.2 %  Comprehensive metabolic panel   Collection Time: 10/12/19  5:06 AM  Result Value Ref Range   Sodium 140 135 - 145 mmol/L   Potassium 3.7 3.5 - 5.1 mmol/L   Chloride 105 98 - 111 mmol/L   CO2 20 (L) 22 - 32 mmol/L   Glucose, Bld 94 70 - 99 mg/dL   BUN 9 6 - 20 mg/dL   Creatinine, Ser 0.91 0.44 - 1.00 mg/dL   Calcium 9.3 8.9 - 10.3 mg/dL   Total Protein 4.9 (L) 6.5 - 8.1 g/dL   Albumin 1.8 (L) 3.5 - 5.0 g/dL   AST 42 (H) 15 - 41 U/L   ALT 41 0 - 44 U/L   Alkaline Phosphatase 148 (H) 38 - 126 U/L   Total Bilirubin 0.5 0.3 - 1.2 mg/dL   GFR calc non Af Amer >60 >60 mL/min   GFR calc Af Amer >60 >60 mL/min   Anion gap 15 5 - 15     Medications:  Scheduled . aspirin  81 mg Oral Daily  . docusate sodium  100 mg Oral Daily  . DULoxetine  40 mg Oral Daily  . famotidine  40 mg Oral Daily  . prenatal multivitamin  1 tablet Oral Q1200   I have reviewed the patient's current medications.  ASSESSMENT: Patient Active Problem List   Diagnosis Date Noted  . Preeclampsia, third trimester 10/11/2019  . PTSD (post-traumatic stress disorder) 10/10/2019  . Rubella non-immune status, antepartum 05/16/2019  . Supervision of high risk pregnancy, antepartum 05/09/2019  . Dichorionic diamniotic twin pregnancy 05/09/2019  . Infertility, female 10/26/2017  . Dyspepsia 03/19/2017  . Dysphagia  01/06/2017  . GAD  (generalized anxiety disorder) 12/10/2016  . Acid reflux 12/10/2016  . Overweight 12/10/2016    PLAN: Labs are stable today and BP is not severe range. If significant lab abnormality, severe sx or BP plan delivery, o/w delivery at 36 weeks. [place] today  Scheryl Darter 10/12/2019,9:16 AM

## 2019-10-13 ENCOUNTER — Other Ambulatory Visit: Payer: Self-pay

## 2019-10-13 ENCOUNTER — Encounter (HOSPITAL_COMMUNITY): Payer: Self-pay | Admitting: Obstetrics & Gynecology

## 2019-10-13 DIAGNOSIS — Z3A35 35 weeks gestation of pregnancy: Secondary | ICD-10-CM

## 2019-10-13 LAB — CBC
HCT: 30.2 % — ABNORMAL LOW (ref 36.0–46.0)
Hemoglobin: 9.5 g/dL — ABNORMAL LOW (ref 12.0–15.0)
MCH: 24.9 pg — ABNORMAL LOW (ref 26.0–34.0)
MCHC: 31.5 g/dL (ref 30.0–36.0)
MCV: 79.1 fL — ABNORMAL LOW (ref 80.0–100.0)
Platelets: 169 10*3/uL (ref 150–400)
RBC: 3.82 MIL/uL — ABNORMAL LOW (ref 3.87–5.11)
RDW: 15.4 % (ref 11.5–15.5)
WBC: 10.9 10*3/uL — ABNORMAL HIGH (ref 4.0–10.5)
nRBC: 0.6 % — ABNORMAL HIGH (ref 0.0–0.2)

## 2019-10-13 LAB — CULTURE, BETA STREP (GROUP B ONLY)

## 2019-10-13 LAB — COMPREHENSIVE METABOLIC PANEL WITH GFR
ALT: 56 U/L — ABNORMAL HIGH (ref 0–44)
AST: 47 U/L — ABNORMAL HIGH (ref 15–41)
Albumin: 1.8 g/dL — ABNORMAL LOW (ref 3.5–5.0)
Alkaline Phosphatase: 149 U/L — ABNORMAL HIGH (ref 38–126)
Anion gap: 9 (ref 5–15)
BUN: 10 mg/dL (ref 6–20)
CO2: 19 mmol/L — ABNORMAL LOW (ref 22–32)
Calcium: 8.3 mg/dL — ABNORMAL LOW (ref 8.9–10.3)
Chloride: 110 mmol/L (ref 98–111)
Creatinine, Ser: 0.96 mg/dL (ref 0.44–1.00)
GFR calc Af Amer: >60 mL/min
GFR calc non Af Amer: >60 mL/min
Glucose, Bld: 89 mg/dL (ref 70–99)
Potassium: 3.8 mmol/L (ref 3.5–5.1)
Sodium: 138 mmol/L (ref 135–145)
Total Bilirubin: 0.4 mg/dL (ref 0.3–1.2)
Total Protein: 4.8 g/dL — ABNORMAL LOW (ref 6.5–8.1)

## 2019-10-13 MED ORDER — TERBUTALINE SULFATE 1 MG/ML IJ SOLN: 0.2500 mg | Freq: Once | INTRAMUSCULAR | Status: DC | PRN

## 2019-10-13 MED ORDER — MISOPROSTOL 50MCG HALF TABLET
50.0000 ug | ORAL_TABLET | ORAL | Status: DC | PRN
  Administered 2019-10-13 (×2): 50 ug via BUCCAL
  Filled 2019-10-13 (×2): qty 1

## 2019-10-13 MED ORDER — PHENYLEPHRINE 40 MCG/ML (10ML) SYRINGE FOR IV PUSH (FOR BLOOD PRESSURE SUPPORT): 80.0000 ug | PREFILLED_SYRINGE | INTRAVENOUS | Status: DC | PRN

## 2019-10-13 MED ORDER — OXYTOCIN 40 UNITS IN NORMAL SALINE INFUSION - SIMPLE MED
2.5000 [IU]/h | INTRAVENOUS | Status: DC
  Filled 2019-10-13: qty 1000

## 2019-10-13 MED ORDER — LABETALOL HCL 5 MG/ML IV SOLN
20.0000 mg | Freq: Once | INTRAVENOUS | Status: DC
  Filled 2019-10-13: qty 4

## 2019-10-13 MED ORDER — OXYCODONE-ACETAMINOPHEN 5-325 MG PO TABS: 2.0000 | ORAL_TABLET | ORAL | Status: DC | PRN

## 2019-10-13 MED ORDER — EPHEDRINE 5 MG/ML INJ: 10.0000 mg | INTRAVENOUS | Status: DC | PRN

## 2019-10-13 MED ORDER — OXYCODONE-ACETAMINOPHEN 5-325 MG PO TABS: 1.0000 | ORAL_TABLET | ORAL | Status: DC | PRN

## 2019-10-13 MED ORDER — LACTATED RINGERS IV SOLN
500.0000 mL | Freq: Once | INTRAVENOUS | Status: AC
  Administered 2019-10-14: 500 mL via INTRAVENOUS

## 2019-10-13 MED ORDER — SOD CITRATE-CITRIC ACID 500-334 MG/5ML PO SOLN
30.0000 mL | ORAL | Status: DC | PRN
  Administered 2019-10-15: 15 mL via ORAL
  Filled 2019-10-13: qty 30

## 2019-10-13 MED ORDER — LABETALOL HCL 5 MG/ML IV SOLN: 40.0000 mg | INTRAVENOUS | Status: DC | PRN

## 2019-10-13 MED ORDER — MAGNESIUM SULFATE BOLUS VIA INFUSION
4.0000 g | Freq: Once | INTRAVENOUS | Status: AC
  Administered 2019-10-13: 4 g via INTRAVENOUS
  Filled 2019-10-13: qty 1000

## 2019-10-13 MED ORDER — DIPHENHYDRAMINE HCL 50 MG/ML IJ SOLN: 12.5000 mg | INTRAMUSCULAR | Status: DC | PRN

## 2019-10-13 MED ORDER — MAGNESIUM SULFATE 40 GM/1000ML IV SOLN
1.0000 g/h | INTRAVENOUS | Status: DC
  Administered 2019-10-14: 2 g/h via INTRAVENOUS
  Filled 2019-10-13 (×3): qty 1000

## 2019-10-13 MED ORDER — LABETALOL HCL 5 MG/ML IV SOLN
20.0000 mg | INTRAVENOUS | Status: DC | PRN
  Administered 2019-10-14: 20 mg via INTRAVENOUS
  Filled 2019-10-13: qty 4

## 2019-10-13 MED ORDER — LACTATED RINGERS IV SOLN: 500.0000 mL | INTRAVENOUS | Status: DC | PRN

## 2019-10-13 MED ORDER — HYDRALAZINE HCL 20 MG/ML IJ SOLN: 10.0000 mg | INTRAMUSCULAR | Status: DC | PRN

## 2019-10-13 MED ORDER — OXYTOCIN BOLUS FROM INFUSION: 500.0000 mL | Freq: Once | INTRAVENOUS | Status: DC

## 2019-10-13 MED ORDER — ONDANSETRON HCL 4 MG/2ML IJ SOLN
4.0000 mg | Freq: Four times a day (QID) | INTRAMUSCULAR | Status: DC | PRN
  Administered 2019-10-14: 4 mg via INTRAVENOUS
  Filled 2019-10-13: qty 2

## 2019-10-13 MED ORDER — LACTATED RINGERS IV SOLN: INTRAVENOUS | Status: DC

## 2019-10-13 MED ORDER — LABETALOL HCL 5 MG/ML IV SOLN: 80.0000 mg | INTRAVENOUS | Status: DC | PRN

## 2019-10-13 MED ORDER — FENTANYL CITRATE (PF) 100 MCG/2ML IJ SOLN
50.0000 ug | INTRAMUSCULAR | Status: DC | PRN
  Filled 2019-10-13: qty 2

## 2019-10-13 MED ORDER — PENICILLIN G POT IN DEXTROSE 60000 UNIT/ML IV SOLN: 3.0000 10*6.[IU] | INTRAVENOUS | Status: DC

## 2019-10-13 MED ORDER — LIDOCAINE HCL (PF) 1 % IJ SOLN: 30.0000 mL | INTRAMUSCULAR | Status: DC | PRN

## 2019-10-13 MED ORDER — ACETAMINOPHEN 325 MG PO TABS: 650.0000 mg | ORAL_TABLET | ORAL | Status: DC | PRN

## 2019-10-13 MED ORDER — OXYTOCIN 40 UNITS IN NORMAL SALINE INFUSION - SIMPLE MED
1.0000 m[IU]/min | INTRAVENOUS | Status: DC
  Administered 2019-10-13: 2 m[IU]/min via INTRAVENOUS
  Administered 2019-10-14: 32 m[IU]/min via INTRAVENOUS
  Filled 2019-10-13 (×2): qty 1000

## 2019-10-13 MED ORDER — SODIUM CHLORIDE 0.9 % IV SOLN: 5.0000 10*6.[IU] | Freq: Once | INTRAVENOUS | Status: DC

## 2019-10-13 MED ORDER — FENTANYL-BUPIVACAINE-NACL 0.5-0.125-0.9 MG/250ML-% EP SOLN
12.0000 mL/h | EPIDURAL | Status: DC | PRN
  Administered 2019-10-14 – 2019-10-15 (×2): 12 mL/h via EPIDURAL
  Filled 2019-10-13 (×3): qty 250

## 2019-10-13 NOTE — Progress Notes (Signed)
IV team came to start IV. Re-assessed BP and it was not in severe range. Held 20 mg labetalol IV dose. Dr. Debroah Loop made aware. No new orders received at this time.

## 2019-10-13 NOTE — Consult Note (Signed)
Neonatology Consult  Note:  At the request of the patients obstetrician Dr. Darene Lamer I met with Esha Fincher who is a 34 y.o. G1P0000 at 29w0dadmitted for induction of labor due to pre-Eclampsia. Pregnancy complicated by di/di twin gestation. We discussed the fact that her babies may not require NICU care however they are at increased risk for needing support given 35 week prematurity and multiple gestation.  We reviewed initial delivery room management, including the likelyhood that they will not need support vs. CPAP, [place], and low but possible need for intubation.  We discussed feeding immaturity, increased risk of jaundice and temperature instability.    Thank you for allowing [place] participate in her care.  Please call with questions.  BHiginio Roger DO  Neonatologist   The total length of face-to-face or floor / unit time for this encounter was 20 minutes.  Counseling and / or coordination of care was greater than fifty percent of the time.

## 2019-10-13 NOTE — Progress Notes (Signed)
Pt had 2 unsuccessful IV start attempts. STAT IV consult order placed. Dr. Debroah Loop made aware of situation. No new orders received at this time.

## 2019-10-13 NOTE — Progress Notes (Signed)
Patient ID: Rachel Roy, female   DOB: 1985/11/06, 34 y.o.   MRN: [id number]  FACULTY PRACTICE ANTEPARTUM(COMPREHENSIVE) NOTE  Rachel Roy is a 34 y.o. G1P0000 at [redacted]w[redacted]d by early ultrasound who is admitted for preeclampsia and twins.   Fetal presentation is cephalic. Length of Stay:  2  Days  Subjective: No headache Patient reports the fetal movement as active. Patient reports uterine contraction  activity as none. Patient reports  vaginal bleeding as none. Patient describes fluid per vagina as None.  Vitals:  Blood pressure (!) 164/86, pulse 67, temperature (!) 97.1 F (36.2 C), temperature source Oral, resp. rate 18, height 5\' 7"  (1.702 m), weight 116.5 kg, last menstrual period 02/10/2019, SpO2 100 %. Physical Examination:  General appearance - alert, well appearing, and in no distress Heart - normal rate and regular rhythm Abdomen - soft, nontender, nondistended Fundal Height:  consistent with twins Cervical Exam: Not evaluated.  Membranes:intact  Fetal Monitoring:  Fetal Heart Rate A  Mode External  [removed] filed at 10/12/2019 2215  Baseline Rate (A) 140 bpm filed at 10/12/2019 2215  Variability 6-25 BPM filed at 10/12/2019 2215  Accelerations 15 x 15 filed at 10/12/2019 2215  Decelerations None filed at 10/12/2019 2215  Multiple birth? Y filed at 10/12/2019 2215  Fetal Heart Rate Fetus B  Mode External  [removed] filed at 10/12/2019 2215  Baseline Rate (B) 145 BPM filed at 10/12/2019 2215  Variability 6-25 BPM filed at 10/12/2019 2215  Accelerations 15 x 15 filed at 10/12/2019 2215  Decelerations None filed at 10/12/2019 1600       Labs:  Results for orders placed or performed during the hospital encounter of 10/11/19 (from the past 24 hour(s))  Comprehensive metabolic panel   Collection Time: 10/13/19  5:19 AM  Result Value Ref Range   Sodium 138 135 - 145 mmol/L   Potassium 3.8 3.5 - 5.1 mmol/L   Chloride 110 98 - 111 mmol/L   CO2 19 (L) 22 - 32  mmol/L   Glucose, Bld 89 70 - 99 mg/dL   BUN 10 6 - 20 mg/dL   Creatinine, Ser 10/15/19 0.44 - 1.00 mg/dL   Calcium 8.3 (L) 8.9 - 10.3 mg/dL   Total Protein 4.8 (L) 6.5 - 8.1 g/dL   Albumin 1.8 (L) 3.5 - 5.0 g/dL   AST 47 (H) 15 - 41 U/L   ALT 56 (H) 0 - 44 U/L   Alkaline Phosphatase 149 (H) 38 - 126 U/L   Total Bilirubin 0.4 0.3 - 1.2 mg/dL   GFR calc non Af Amer >60 >60 mL/min   GFR calc Af Amer >60 >60 mL/min   Anion gap 9 5 - 15  CBC   Collection Time: 10/13/19  5:19 AM  Result Value Ref Range   WBC 10.9 (H) 4.0 - 10.5 K/uL   RBC 3.82 (L) 3.87 - 5.11 MIL/uL   Hemoglobin 9.5 (L) 12.0 - 15.0 g/dL   HCT 10/15/19 (L) 81.1 - 91.4 %   MCV 79.1 (L) 80.0 - 100.0 fL   MCH 24.9 (L) 26.0 - 34.0 pg   MCHC 31.5 30.0 - 36.0 g/dL   RDW 78.2 95.6 - 21.3 %   Platelets 169 150 - 400 K/uL   nRBC 0.6 (H) 0.0 - 0.2 %     Medications:  Scheduled . aspirin  81 mg Oral Daily  . docusate sodium  100 mg Oral Daily  . DULoxetine  40 mg Oral Daily  . famotidine  40 mg  Oral Daily  . labetalol  20 mg Intravenous Once  . prenatal multivitamin  1 tablet Oral Q1200   I have reviewed the patient's current medications.  ASSESSMENT: Patient Active Problem List   Diagnosis Date Noted  . Preeclampsia, third trimester 10/11/2019  . PTSD (post-traumatic stress disorder) 10/10/2019  . Rubella non-immune status, antepartum 05/16/2019  . Supervision of high risk pregnancy, antepartum 05/09/2019  . Dichorionic diamniotic twin pregnancy 05/09/2019  . Infertility, female 10/26/2017  . Dyspepsia 03/19/2017  . Dysphagia 01/06/2017  . GAD (generalized anxiety disorder) 12/10/2016  . Acid reflux 12/10/2016  . Overweight 12/10/2016    PLAN: Elevated BP this morning with rising LFT and Cr, severe features now present with indication for IOL at 35 weeks today. Transfer to L&D  Emeterio Reeve 10/13/2019,8:50 AM

## 2019-10-13 NOTE — Progress Notes (Signed)
LABOR PROGRESS NOTE  Rachel Roy is a 34 y.o. G1P0000 at [redacted]w[redacted]d admitted for IOL PreE w/ SF (BP)  Subjective: Comfortable, feels some nausea and light headedness with positional changes. Requesting zofran.  Objective: BP (!) 141/80    Pulse 85    Temp 98.3 F (36.8 C) (Oral)    Resp 16    Ht 5\' 7"  (1.702 m)    Wt 115.7 kg    LMP 02/10/2019    SpO2 96%    BMI 39.94 kg/m   Dilation: 4.5 Effacement (%): 70 Station: -2 Presentation: Vertex Exam by:: [email or web address], MD & Lorri Frederick, RN Fetal monitoring: Baseline: 135 bpm, Variability: Good {> 6 bpm), Accelerations: Reactive and Decelerations: Absent Uterine activity: Frequency: Every 3-5 minutes and Intensity: mod  Labs: Lab Results  Component Value Date   WBC 10.9 (H) 10/13/2019   HGB 9.5 (L) 10/13/2019   HCT 30.2 (L) 10/13/2019   MCV 79.1 (L) 10/13/2019   PLT 169 10/13/2019    Patient Active Problem List   Diagnosis Date Noted   Preeclampsia, third trimester 10/11/2019   PTSD (post-traumatic stress disorder) 10/10/2019   Rubella non-immune status, antepartum 05/16/2019   Supervision of high risk pregnancy, antepartum 05/09/2019   Dichorionic diamniotic twin pregnancy 05/09/2019   Infertility, female 10/26/2017   Dyspepsia 03/19/2017   Dysphagia 01/06/2017   GAD (generalized anxiety disorder) 12/10/2016   Acid reflux 12/10/2016   Overweight 12/10/2016    Assessment / Plan: Induction of labor due to preeclampsia w/ SF (BP)  #Labor: Di-di twins. Preterm, BMZ x2 (1/25, 1/26). Progressing well w/ standard labor augmentation measures. S/p Cytotec x2. S/p FB. Pitocin currently @ 10 mu/min. #Fetal Wellbeing:  Category I #Pain Control: Epidural and IV pain meds upon request #ID: GBS neg #Anticipated MOD: NSVD  #PreE: Elevated Cr and SBP >160 on admission. Pr/Cr ratio 0.24. Continue to monitor BP most recently all values <160/110. PRN labetalol protocol. Magnesium 2g/h   2/26, DO,  PGY-1 Family Medicine Resident, [facility] Faculty Teaching Service  10/14/2019, 12:03 AM

## 2019-10-13 NOTE — Progress Notes (Signed)
Rachel Roy is a 34 y.o. G1P0000 at [redacted]w[redacted]d admitted for induction of labor due to pre-E with sf by BP.  Subjective: Introduced myself to patient. She is very anxious about delivery. FOB at bedside and supportive. All questions answered.  Objective: BP (!) 149/92   Pulse (!) 104   Temp 98.2 F (36.8 C) (Oral)   Resp 16   Ht 5\' 7"  (1.702 m)   Wt 115.7 kg   LMP 02/10/2019   SpO2 95%   BMI 39.94 kg/m  No intake/output data recorded.  FHT:   Baby A: FHR: 150 bpm, variability: moderate,  accelerations:  Present,  decelerations:  Absent Baby B: FHR: 150 bpm, variability: moderate,  accelerations:  Present,  decelerations:  Absent UC:   none  SVE: closed/thick/-3  Labs: Lab Results  Component Value Date   WBC 10.9 (H) 10/13/2019   HGB 9.5 (L) 10/13/2019   HCT 30.2 (L) 10/13/2019   MCV 79.1 (L) 10/13/2019   PLT 169 10/13/2019    Assessment / Plan: 34 yo G1P0000 at 35.[redacted] weeks EGA with Di/Di twins here for IOL 2/2 pre-E with SF (BP)  Labor: Bishop score 2, will initiate cervical ripening with cytotec. Consider FB when appropriate. Fetal Wellbeing:  Category I x2. NICU consulted, Dr. 32 aware. Pain Control:  per patient request Pre-eclampsia: severe features by BP. On Magnesium. No vision changes, SOB, HA or RUQ pain. Continue to monitor closely. I/D:  Unknown, preterm. Will give PCN. Anticipated MOD:  vaginal, CS as appropriate.   Risks and benefits of induction were reviewed, including failure of method, prolonged labor, need for further intervention, risk of cesarean.  We also discussed the possibility that Baby B may flip after delivery of Baby A, and that either breech extraction or delivery by Cesarean may need to occur. Patient and family seem to understand these risks and wish to proceed. Options of cytotec, foley bulb, AROM, and pitocin reviewed, with use of each discussed.  Algernon Huxley DO OB Fellow, Faculty Practice 10/13/2019, 11:33 AM

## 2019-10-13 NOTE — Progress Notes (Addendum)
LABOR PROGRESS NOTE  Rachel Roy is a 34 y.o. G1P0000 at [redacted]w[redacted]d admitted for IOL PreE w/ SF (BP)  Subjective: Patient doing well, comfortable laying on right side. States the FB fell out about 1h ago and has improved perineal pressure since then. She has no concerns or complaints at this time.  Objective: BP (!) 151/87   Pulse 95   Temp 98.3 F (36.8 C) (Oral)   Resp 16   Ht 5\' 7"  (1.702 m)   Wt 115.7 kg   LMP 02/10/2019   SpO2 96%   BMI 39.94 kg/m   Dilation: 4.5 Effacement (%): 70 Station: -2 Presentation: Vertex Exam by:: [email or web address], MD & Lorri Frederick, RN Fetal monitoring: Baseline: 140 bpm, Variability: Good {> 6 bpm), Accelerations: Reactive and Decelerations: Absent Uterine activity: Frequency: Every 2-5 minutes and Intensity: mild  Labs: Lab Results  Component Value Date   WBC 10.9 (H) 10/13/2019   HGB 9.5 (L) 10/13/2019   HCT 30.2 (L) 10/13/2019   MCV 79.1 (L) 10/13/2019   PLT 169 10/13/2019    Patient Active Problem List   Diagnosis Date Noted  . Preeclampsia, third trimester 10/11/2019  . PTSD (post-traumatic stress disorder) 10/10/2019  . Rubella non-immune status, antepartum 05/16/2019  . Supervision of high risk pregnancy, antepartum 05/09/2019  . Dichorionic diamniotic twin pregnancy 05/09/2019  . Infertility, female 10/26/2017  . Dyspepsia 03/19/2017  . Dysphagia 01/06/2017  . GAD (generalized anxiety disorder) 12/10/2016  . Acid reflux 12/10/2016  . Overweight 12/10/2016    Assessment / Plan: Induction of labor due to preeclampsia w/ SF (BP)  #Labor: Preterm, BMZ x2 (1/25, 1/26). Progressing well w/ standard labor augmentation measures. S/p Cytotec x2. S/p FB. Will plan to start Pitocin now, goal ctx 2-3min apart #Fetal Wellbeing:  Category I #Pain Control: Epidural and IV pain meds upon request #ID: GBS neg #Anticipated MOD: NSVD  #PreE: Elevated Cr and SBP >160. Pr/Cr ratio 0.24. Continue to monitor BP, PRN labetalol.  Magnesium 2g/h   1m, DO, PGY-1 Family Medicine Resident, [facility] Faculty Teaching Service  10/13/2019, 8:23 PM

## 2019-10-13 NOTE — Progress Notes (Addendum)
Labor Progress Note Rachel Roy is a 34 y.o. G1P0000 at [redacted]w[redacted]d presented for IOL d/t severe PEC S: Patient resting comfortably in bed  O:  BP 138/76   Pulse 71   Temp 98.2 F (36.8 C) (Oral)   Resp 16   Ht 5\' 7"  (1.702 m)   Wt 115.7 kg   LMP 02/10/2019   SpO2 96%   BMI 39.94 kg/m   02/12/2019: both baby A and B are vertex EFM:  Baby A 140 bpm/+accel/-decel Baby B 140 bpm/+accel/-decel  CVE: Dilation: Closed Effacement (%): Thick Station: -3 Presentation: Vertex Exam by:: Dr. [email or web address]   A&P: 34 y.o. G1P0000 [redacted]w[redacted]d here for IOL d/t severe PEC #Labor: Progressing well. FB placed by Dr. [redacted]w[redacted]d, second cytotec given now. Both fetuses are vertex by BSUS.  #Pain: Per patient request #FWB: Cat I x2 #GBS negative #severe PEC: on Mag, remains asymptomatic. Most recent BP mildly elevated to 140-150s/80-90s.   Rachel Mast, MD 3:43 PM  GME ATTESTATION:  I saw and evaluated the patient. I personally placed the FB with 60 cc of NS. I agree with the findings and the plan of care as documented in the resident's note.  Rachel Mylar, DO OB Fellow, Faculty [facility], Center for [facility] 10/13/2019 3:51 PM

## 2019-10-14 ENCOUNTER — Inpatient Hospital Stay (HOSPITAL_COMMUNITY): Payer: 59 | Admitting: Anesthesiology

## 2019-10-14 LAB — CBC
HCT: 38 % (ref 36.0–46.0)
Hemoglobin: 12 g/dL (ref 12.0–15.0)
MCH: 24.5 pg — ABNORMAL LOW (ref 26.0–34.0)
MCHC: 31.6 g/dL (ref 30.0–36.0)
MCV: 77.7 fL — ABNORMAL LOW (ref 80.0–100.0)
Platelets: 202 10*3/uL (ref 150–400)
RBC: 4.89 MIL/uL (ref 3.87–5.11)
RDW: 15.3 % (ref 11.5–15.5)
WBC: 13 10*3/uL — ABNORMAL HIGH (ref 4.0–10.5)
nRBC: 0.2 % (ref 0.0–0.2)

## 2019-10-14 MED ORDER — LIDOCAINE HCL (PF) 1 % IJ SOLN
INTRAMUSCULAR | Status: DC | PRN
  Administered 2019-10-14: 10 mL via EPIDURAL

## 2019-10-14 MED ORDER — SODIUM CHLORIDE (PF) 0.9 % IJ SOLN
INTRAMUSCULAR | Status: DC | PRN
  Administered 2019-10-14: 12 mL/h via EPIDURAL

## 2019-10-14 MED ORDER — FENTANYL CITRATE (PF) 100 MCG/2ML IJ SOLN
100.0000 ug | INTRAMUSCULAR | Status: DC | PRN
  Administered 2019-10-14: 100 ug via INTRAVENOUS
  Filled 2019-10-14: qty 2

## 2019-10-14 MED ORDER — FENTANYL CITRATE (PF) 100 MCG/2ML IJ SOLN
INTRAMUSCULAR | Status: DC | PRN
  Administered 2019-10-14: 100 ug via EPIDURAL

## 2019-10-14 MED ORDER — BUPIVACAINE HCL (PF) 0.25 % IJ SOLN
INTRAMUSCULAR | Status: DC | PRN
  Administered 2019-10-14: 10 mL via EPIDURAL
  Administered 2019-10-14: 8 mL via EPIDURAL

## 2019-10-14 NOTE — Anesthesia Preprocedure Evaluation (Signed)
Anesthesia Evaluation  Patient identified by MRN, date of birth, ID band Patient awake    Reviewed: Allergy & Precautions, H&P , NPO status , Patient's Chart, lab work & pertinent test results  History of Anesthesia Complications Negative for: history of anesthetic complications  Airway Mallampati: II  TM Distance: >3 FB Neck ROM: full    Dental no notable dental hx.    Pulmonary neg pulmonary ROS,    Pulmonary exam normal        Cardiovascular hypertension, Normal cardiovascular exam Rhythm:regular Rate:Normal     Neuro/Psych PSYCHIATRIC DISORDERS Anxiety negative neurological ROS     GI/Hepatic Neg liver ROS, GERD  ,  Endo/Other  Morbid obesity  Renal/GU      Musculoskeletal   Abdominal   Peds  Hematology negative hematology ROS (+)   Anesthesia Other Findings Pre-eclampsia on mag Twin gestation  Reproductive/Obstetrics (+) Pregnancy                             Anesthesia Physical Anesthesia Plan  ASA: III  Anesthesia Plan: Epidural   Post-op Pain Management:    Induction:   PONV Risk Score and Plan:   Airway Management Planned:   Additional Equipment:   Intra-op Plan:   Post-operative Plan:   Informed Consent: I have reviewed the patients History and Physical, chart, labs and discussed the procedure including the risks, benefits and alternatives for the proposed anesthesia with the patient or authorized representative who has indicated his/her understanding and acceptance.       Plan Discussed with:   Anesthesia Plan Comments:         Anesthesia Quick Evaluation

## 2019-10-14 NOTE — Progress Notes (Signed)
LABOR PROGRESS NOTE  Rachel Roy is a 34 y.o. G1P0000 at [redacted]w[redacted]d admitted for IOL PreE w/ SF (BP)  Subjective: Comfortably sleeping upon entering room this morning. No longer feeling nauseous.  Objective: BP 134/75    Pulse 79    Temp 97.7 F (36.5 C) (Oral)    Resp 16    Ht 5\' 7"  (1.702 m)    Wt 115.7 kg    LMP 02/10/2019    SpO2 96%    BMI 39.94 kg/m   Dilation: 4.5 Effacement (%): 70 Cervical Position: Posterior Station: -2 Presentation: Vertex Exam by:: [email or web address], MD & Lorri Frederick, RN Fetal monitoring: Baseline: A: 150 bpm, B: 125 bpm, Variability: Good {> 6 bpm), Accelerations: Reactive and Decelerations: Absent Uterine activity: Frequency: Every 3-5 minutes and Intensity: mod  Labs: Lab Results  Component Value Date   WBC 10.9 (H) 10/13/2019   HGB 9.5 (L) 10/13/2019   HCT 30.2 (L) 10/13/2019   MCV 79.1 (L) 10/13/2019   PLT 169 10/13/2019    Patient Active Problem List   Diagnosis Date Noted   Preeclampsia, third trimester 10/11/2019   PTSD (post-traumatic stress disorder) 10/10/2019   Rubella non-immune status, antepartum 05/16/2019   Supervision of high risk pregnancy, antepartum 05/09/2019   Dichorionic diamniotic twin pregnancy 05/09/2019   Infertility, female 10/26/2017   Dyspepsia 03/19/2017   Dysphagia 01/06/2017   GAD (generalized anxiety disorder) 12/10/2016   Acid reflux 12/10/2016   Overweight 12/10/2016    Assessment / Plan: Induction of labor due to preeclampsia w/ SF (BP)  #Labor: Di-di twins. Preterm, BMZ x2 (1/25, 1/26). Slowly progressing, has not made significant cervical change in last 8h. S/p Cytotec x2. S/p FB. Pitocin currently @ 12 mu/min. Consider AROM at next check #Fetal Wellbeing:  Category I #Pain Control: Epidural and IV pain meds upon request #ID: GBS [id number] #Anticipated MOD: NSVD  #PreE: Elevated Cr and SBP >160 on admission. Pr/Cr ratio 0.24. Continue to monitor BP most recent values <160/110. PRN  labetalol protocol. Magnesium 2g/h   2/26, DO, PGY-1 Family Medicine Resident, [facility] Faculty Teaching Service  10/14/2019, 5:42 AM

## 2019-10-14 NOTE — Progress Notes (Signed)
Rachel Roy is a 34 y.o. G1P0000 at [redacted]w[redacted]d by ultrasound admitted for induction of labor due to pre-eclampsia.  Subjective: Doing well, more comfortable now.  No complaints.  Objective: BP (!) 144/86   Pulse 81   Temp 97.6 F (36.4 C) (Oral)   Resp 18   Ht 5\' 7"  (1.702 m)   Wt 115.7 kg   LMP 02/10/2019   SpO2 96%   BMI 39.94 kg/m  I/O last 3 completed shifts: In: 7828 [P.O.:5410; I.V.:2418] Out: 02/12/2019 [Urine:11050] Total I/O In: 596.4 [I.V.:596.4] Out: 500 [Urine:500]  FHT: Baby A: FHR: 120 bpm, variability: moderate,  accelerations:  Present,  decelerations:  Absent  Baby B: FHR: 120 bpm, moderate variability, accels, no decels. UC:   regular, every 4  minutes SVE:   Dilation: 4.5 Effacement (%): 60, 70 Station: -2 Exam by:: [email or web address], RN   Labs: Lab Results  Component Value Date   WBC 13.0 (H) 10/14/2019   HGB 12.0 10/14/2019   HCT 38.0 10/14/2019   MCV 77.7 (L) 10/14/2019   PLT 202 10/14/2019    Assessment / Plan: Induction of labor due to preeclampsia,  progressing well on pitocin  -Continue to titrate pitocin.  Labor: Latent Preeclampsia:  Denies signs and symptoms Fetal Wellbeing:  Category I Pain Control:  Epidural I/D:  n/a Anticipated MOD:  NSVD  10/16/2019, SNM 10/14/2019, 1:25 PM

## 2019-10-14 NOTE — Progress Notes (Signed)
Labor Progress Note Rachel Roy is a 34 y.o. G1P0000 at [redacted]w[redacted]d presented for IOL for severe preeclampsia.  S:  Feeling more uncomfortable again with more pressure and cervical exam performed by RN without change.   O:  BP (!) 155/99   Pulse 78   Temp 97.8 F (36.6 C) (Oral)   Resp 16   Ht 5\' 7"  (1.702 m)   Wt 115.7 kg   LMP 02/10/2019   SpO2 (!) 89%   BMI 39.94 kg/m   Fetal Tracing:  Baby A  Baseline: 120 Variability: moderate Accels: 15x15 Decels: none  Baby B  Baseline: 120 Variability: moderate Accels: 15x15 Decels: none  Toco: q74m   CVE: Dilation: 7 Effacement (%): 100 Cervical Position: Posterior Station: 0 Presentation: Vertex Exam by:: [email or web address], RN   A&P: 34 y.o. G1P0000 [redacted]w[redacted]d IOL preeclampsia #Labor: Titrate Pitocin to max 40. S/p new bag of Pitocin. Cont position changes. Anticipate SVD; CS as appropriate. Offered IV Benadryl due to RN reporting "swollen cervix" and patient declined. #Severe Pre-E: Patient with severe range pressures earlier this evening and IV Labetalol given x1 with now mild-range BP's. Cont Mag. Repeat CMP/CBC AM. Cont to monitor. Asymptomatic. #Pain: epidural; okay to give IV Fentanyl in addition to epidural  #FWB: Cat I x2 #GBS negative  [redacted]w[redacted]d, MD 10:10 PM

## 2019-10-14 NOTE — Progress Notes (Signed)
Labor Progress Note Rachel Roy is a 34 y.o. G1P0000 at [redacted]w[redacted]d presented for IOL for preeclampsia  S:  Patient reporting severe back pain and right lower pelvis pain  O:  BP (!) 163/97   Pulse (!) 104   Temp 97.6 F (36.4 C) (Oral)   Resp 16   Ht 5\' 7"  (1.702 m)   Wt 115.7 kg   LMP 02/10/2019   SpO2 (!) 89%   BMI 39.94 kg/m   Fetal Tracing:  Baby A  Baseline: 120 Variability: moderate Accels: 15x15 Decels: none  Baby B  Baseline: 120 Variability: moderate Accels: 15x15 Decels: none  Toco: 3-5   CVE: Dilation: 7 Effacement (%): 100 Cervical Position: Posterior Station: 0, Plus 1 Presentation: Vertex Exam by:: [email or web address], CNM    A&P: 34 y.o. G1P0000 [redacted]w[redacted]d IOL preeclampsia #Labor: Epidural redosed without improvement. Encouraged patient to press PCA button as needed. Discussed need for position changes to encourage fetal rotation and patient verbalized understanding but unwilling to do that at this time due to pain. Will hold pitocin at 32mu/min due to good cervical change.  New onset severe range BPs. Likely due to pain. Will give IV labetalol.   #Pain: epidural #FWB: Cat 1 #GBS negative  31m, CNM 7:07 PM

## 2019-10-14 NOTE — Progress Notes (Signed)
Labor Progress Note Rachel Roy is a 34 y.o. G1P0000 at [redacted]w[redacted]d presented for IOL for severe preeclampsia.  S:  Resting on left side.   O:  BP (!) 132/91   Pulse 72   Temp 97.8 F (36.6 C) (Oral)   Resp 16   Ht 5\' 7"  (1.702 m)   Wt 115.7 kg   LMP 02/10/2019   SpO2 (!) 89%   BMI 39.94 kg/m   Fetal Tracing:  Baby A  Baseline: 120 Variability: moderate Accels: 15x15 Decels: none  Baby B  Baseline: 120 Variability: moderate Accels: 15x15 Decels: none  Toco: q23m   CVE: Dilation: 7 Effacement (%): 100 Cervical Position: Posterior Station: 0, Plus 1 Presentation: Vertex Exam by:: [email or web address], CNM    A&P: 34 y.o. G1P0000 [redacted]w[redacted]d IOL preeclampsia #Labor: Currently on Pit at 30, will increase to 32 due to ctx q32m. Will change out Pitocin bag. Anticipate SVD.  #Severe Pre-E: Patient with severe range pressures earlier this evening and IV Labetalol given x1 with now mild-range BP's. Cont Mag. Repeat CMP/CBC AM. Cont to monitor. Asymptomatic. #Pain: epidural; okay to give IV Fentanyl in addition to epidural  #FWB: Cat I x2 #GBS negative  4m, MD 8:41 PM

## 2019-10-14 NOTE — Anesthesia Procedure Notes (Signed)
Epidural Patient location during procedure: OB Start time: 10/14/2019 9:49 AM End time: 10/14/2019 10:02 AM  Staffing Anesthesiologist: Lucretia Kern, MD Performed: anesthesiologist   Preanesthetic Checklist Completed: patient identified, IV checked, risks and benefits discussed, monitors and equipment checked, pre-op evaluation and timeout performed  Epidural Patient position: sitting Prep: DuraPrep Patient monitoring: heart rate, continuous pulse ox and blood pressure Approach: midline Location: L3-L4 Injection technique: LOR air  Needle:  Needle type: Tuohy  Needle gauge: 17 G Needle length: 9 cm Needle insertion depth: 6 cm Catheter type: closed end flexible Catheter size: 19 Gauge Catheter at skin depth: 11 cm Test dose: negative  Assessment Events: blood not aspirated, injection not painful, no injection resistance, no paresthesia and negative IV test  Additional Notes Reason for block:procedure for pain

## 2019-10-14 NOTE — Progress Notes (Signed)
Rachel Roy is a 34 y.o. G1P0000 at [redacted]w[redacted]d by ultrasound admitted for induction of labor due to pre-eclampsia.  Subjective: Feeling some pressure, cervix is unchanged.  Patient requesting epidural.   Objective: BP (!) 137/94   Pulse (!) 101   Temp 97.7 F (36.5 C) (Oral)   Resp 16   Ht 5\' 7"  (1.702 m)   Wt 115.7 kg   LMP 02/10/2019   SpO2 96%   BMI 39.94 kg/m  I/O last 3 completed shifts: In: 7828 [P.O.:5410; I.V.:2418] Out: 02/12/2019 [Urine:11050] No intake/output data recorded.  FHT: Baby A: FHR: 120 bpm, variability: moderate,  accelerations:  Present,  decelerations:  Absent  Baby B: FHR: 125 bpm, moderate variability, accels, no decels.  UC:   regular, every 2-4 minutes SVE:   Dilation: 4.5 Effacement (%): 60, 70 Station: -2 Exam by:: [email or web address], RN   Labs: Lab Results  Component Value Date   WBC 10.9 (H) 10/13/2019   HGB 9.5 (L) 10/13/2019   HCT 30.2 (L) 10/13/2019   MCV 79.1 (L) 10/13/2019   PLT 169 10/13/2019    Assessment / Plan: Induction of labor due to preeclampsia,  progressing well on pitocin  -Continue to titrate pitocin PRN to achieve contractions that are every 2-3 minutes apart and that are moderate in intensity. -Epidural PRN  Labor: Latent Preeclampsia:  Denies HA, epigastric pain, or visual changes Fetal Wellbeing:  Category I Pain Control:  Epidural I/D:  n/a Anticipated MOD:  NSVD  10/15/2019, SNM 10/14/2019, 9:12 AM

## 2019-10-15 ENCOUNTER — Encounter (HOSPITAL_COMMUNITY): Payer: Self-pay | Admitting: Obstetrics & Gynecology

## 2019-10-15 ENCOUNTER — Encounter (HOSPITAL_COMMUNITY)
Admission: AD | Disposition: A | Discharge status: Home / Self Care | Payer: Self-pay | Source: Home / Self Care | Attending: Obstetrics and Gynecology

## 2019-10-15 DIAGNOSIS — O134 Gestational [pregnancy-induced] hypertension without significant proteinuria, complicating childbirth: Secondary | ICD-10-CM

## 2019-10-15 DIAGNOSIS — Z98891 History of uterine scar from previous surgery: Secondary | ICD-10-CM

## 2019-10-15 DIAGNOSIS — O1414 Severe pre-eclampsia complicating childbirth: Secondary | ICD-10-CM

## 2019-10-15 DIAGNOSIS — O4210 Premature rupture of membranes, onset of labor more than 24 hours following rupture, unspecified weeks of gestation: Secondary | ICD-10-CM

## 2019-10-15 DIAGNOSIS — Z3A35 35 weeks gestation of pregnancy: Secondary | ICD-10-CM

## 2019-10-15 DIAGNOSIS — O42113 Preterm premature rupture of membranes, onset of labor more than 24 hours following rupture, third trimester: Secondary | ICD-10-CM

## 2019-10-15 DIAGNOSIS — O30043 Twin pregnancy, dichorionic/diamniotic, third trimester: Secondary | ICD-10-CM

## 2019-10-15 HISTORY — PX: PERINEAL LACERATION REPAIR: SHX5389

## 2019-10-15 LAB — COMPREHENSIVE METABOLIC PANEL
ALT: 47 U/L — ABNORMAL HIGH (ref 0–44)
AST: 36 U/L (ref 15–41)
Albumin: 1.8 g/dL — ABNORMAL LOW (ref 3.5–5.0)
Alkaline Phosphatase: 198 U/L — ABNORMAL HIGH (ref 38–126)
Anion gap: 11 (ref 5–15)
BUN: 9 mg/dL (ref 6–20)
CO2: 20 mmol/L — ABNORMAL LOW (ref 22–32)
Calcium: 7 mg/dL — ABNORMAL LOW (ref 8.9–10.3)
Chloride: 101 mmol/L (ref 98–111)
Creatinine, Ser: 1.12 mg/dL — ABNORMAL HIGH (ref 0.44–1.00)
GFR calc Af Amer: >60 mL/min (ref >60)
GFR calc non Af Amer: >60 mL/min (ref >60)
Glucose, Bld: 102 mg/dL — ABNORMAL HIGH (ref 70–99)
Potassium: 3.9 mmol/L (ref 3.5–5.1)
Sodium: 132 mmol/L — ABNORMAL LOW (ref 135–145)
Total Bilirubin: 1.2 mg/dL (ref 0.3–1.2)
Total Protein: 5.5 g/dL — ABNORMAL LOW (ref 6.5–8.1)

## 2019-10-15 LAB — CBC
HCT: 36.3 % (ref 36.0–46.0)
HCT: 28.5 % — ABNORMAL LOW (ref 36.0–46.0)
Hemoglobin: 11.3 g/dL — ABNORMAL LOW (ref 12.0–15.0)
Hemoglobin: 8.8 g/dL — ABNORMAL LOW (ref 12.0–15.0)
MCH: 24.7 pg — ABNORMAL LOW (ref 26.0–34.0)
MCH: 24.4 pg — ABNORMAL LOW (ref 26.0–34.0)
MCHC: 31.1 g/dL (ref 30.0–36.0)
MCHC: 30.9 g/dL (ref 30.0–36.0)
MCV: 79.3 fL — ABNORMAL LOW (ref 80.0–100.0)
MCV: 79.2 fL — ABNORMAL LOW (ref 80.0–100.0)
Platelets: 189 10*3/uL (ref 150–400)
Platelets: 158 10*3/uL (ref 150–400)
RBC: 4.58 MIL/uL (ref 3.87–5.11)
RBC: 3.6 MIL/uL — ABNORMAL LOW (ref 3.87–5.11)
RDW: 15.6 % — ABNORMAL HIGH (ref 11.5–15.5)
RDW: 15.5 % (ref 11.5–15.5)
WBC: 12.7 10*3/uL — ABNORMAL HIGH (ref 4.0–10.5)
WBC: 16 10*3/uL — ABNORMAL HIGH (ref 4.0–10.5)
nRBC: 0 % (ref 0.0–0.2)
nRBC: 0 % (ref 0.0–0.2)

## 2019-10-15 LAB — DIC (DISSEMINATED INTRAVASCULAR COAGULATION)PANEL
D-Dimer, Quant: 8.85 ug/mL-FEU — ABNORMAL HIGH (ref 0.00–0.50)
Fibrinogen: 247 mg/dL (ref 210–475)
INR: 1.5 — ABNORMAL HIGH (ref 0.8–1.2)
Platelets: 149 10*3/uL — ABNORMAL LOW (ref 150–400)
Prothrombin Time: 18 seconds — ABNORMAL HIGH (ref 11.4–15.2)
Smear Review: NONE SEEN
aPTT: 50 seconds — ABNORMAL HIGH (ref 24–36)

## 2019-10-15 LAB — MAGNESIUM: Magnesium: 8.3 mg/dL (ref 1.7–2.4)

## 2019-10-15 SURGERY — Surgical Case
Anesthesia: Epidural | Site: Perineum

## 2019-10-15 MED ORDER — OXYTOCIN 40 UNITS IN NORMAL SALINE INFUSION - SIMPLE MED
2.5000 [IU]/h | INTRAVENOUS | Status: AC
  Administered 2019-10-15: 2.5 [IU]/h via INTRAVENOUS

## 2019-10-15 MED ORDER — TETANUS-DIPHTH-ACELL PERTUSSIS 5-2.5-18.5 LF-MCG/0.5 IM SUSP: 0.5000 mL | Freq: Once | INTRAMUSCULAR | Status: DC

## 2019-10-15 MED ORDER — DIBUCAINE (PERIANAL) 1 % EX OINT: 1.0000 "application " | TOPICAL_OINTMENT | CUTANEOUS | Status: DC | PRN

## 2019-10-15 MED ORDER — SCOPOLAMINE 1 MG/3DAYS TD PT72
MEDICATED_PATCH | TRANSDERMAL | Status: DC | PRN
  Administered 2019-10-15: 1 via TRANSDERMAL

## 2019-10-15 MED ORDER — METHYLERGONOVINE MALEATE 0.2 MG/ML IJ SOLN
INTRAMUSCULAR | Status: AC
  Filled 2019-10-15: qty 1

## 2019-10-15 MED ORDER — DIPHENHYDRAMINE HCL 25 MG PO CAPS: 25.0000 mg | ORAL_CAPSULE | Freq: Four times a day (QID) | ORAL | Status: DC | PRN

## 2019-10-15 MED ORDER — FERROUS SULFATE 325 (65 FE) MG PO TABS: 325.0000 mg | ORAL_TABLET | Freq: Two times a day (BID) | ORAL | Status: DC

## 2019-10-15 MED ORDER — COCONUT OIL OIL
1.0000 "application " | TOPICAL_OIL | Status: DC | PRN
  Administered 2019-10-16: 1 via TOPICAL

## 2019-10-15 MED ORDER — ONDANSETRON HCL 4 MG/2ML IJ SOLN
INTRAMUSCULAR | Status: DC | PRN
  Administered 2019-10-15: 4 mg via INTRAVENOUS

## 2019-10-15 MED ORDER — KETOROLAC TROMETHAMINE 60 MG/2ML IM SOLN
INTRAMUSCULAR | Status: AC
  Filled 2019-10-15: qty 2

## 2019-10-15 MED ORDER — TRANEXAMIC ACID-NACL 1000-0.7 MG/100ML-% IV SOLN
INTRAVENOUS | Status: AC
  Administered 2019-10-15: 1000 mg
  Filled 2019-10-15: qty 100

## 2019-10-15 MED ORDER — LIDOCAINE-EPINEPHRINE (PF) 2 %-1:200000 IJ SOLN
INTRAMUSCULAR | Status: AC
  Filled 2019-10-15: qty 20

## 2019-10-15 MED ORDER — LIDOCAINE-EPINEPHRINE (PF) 2 %-1:200000 IJ SOLN
INTRAMUSCULAR | Status: DC | PRN
  Administered 2019-10-15: 2 mL via EPIDURAL
  Administered 2019-10-15: 3 mL via EPIDURAL

## 2019-10-15 MED ORDER — PHENYLEPHRINE HCL (PRESSORS) 10 MG/ML IV SOLN
INTRAVENOUS | Status: DC | PRN
  Administered 2019-10-15 (×2): 100 ug via INTRAVENOUS
  Administered 2019-10-15 (×2): 80 ug via INTRAVENOUS

## 2019-10-15 MED ORDER — BUPIVACAINE HCL (PF) 0.5 % IJ SOLN
INTRAMUSCULAR | Status: AC
  Filled 2019-10-15: qty 30

## 2019-10-15 MED ORDER — SODIUM CHLORIDE 0.9 % IV SOLN
INTRAVENOUS | Status: DC | PRN
  Administered 2019-10-15: 2 g via INTRAVENOUS

## 2019-10-15 MED ORDER — MAGNESIUM HYDROXIDE 400 MG/5ML PO SUSP: 30.0000 mL | ORAL | Status: DC | PRN

## 2019-10-15 MED ORDER — OXYCODONE HCL 5 MG PO TABS: 5.0000 mg | ORAL_TABLET | ORAL | Status: DC | PRN

## 2019-10-15 MED ORDER — MEASLES, MUMPS & RUBELLA VAC IJ SOLR
0.5000 mL | Freq: Once | INTRAMUSCULAR | Status: AC
  Administered 2019-10-18: 0.5 mL via SUBCUTANEOUS
  Filled 2019-10-15: qty 0.5

## 2019-10-15 MED ORDER — LACTATED RINGERS IV SOLN: INTRAVENOUS | Status: DC | PRN

## 2019-10-15 MED ORDER — METHYLERGONOVINE MALEATE 0.2 MG/ML IJ SOLN
INTRAMUSCULAR | Status: DC | PRN
  Administered 2019-10-15: .2 mg via INTRAMUSCULAR

## 2019-10-15 MED ORDER — KETOROLAC TROMETHAMINE 30 MG/ML IJ SOLN
30.0000 mg | Freq: Four times a day (QID) | INTRAMUSCULAR | Status: DC
  Administered 2019-10-15 – 2019-10-16 (×3): 30 mg via INTRAVENOUS
  Filled 2019-10-15 (×2): qty 1

## 2019-10-15 MED ORDER — LACTATED RINGERS IV SOLN: INTRAVENOUS | Status: DC

## 2019-10-15 MED ORDER — SENNOSIDES-DOCUSATE SODIUM 8.6-50 MG PO TABS
2.0000 | ORAL_TABLET | ORAL | Status: DC
  Administered 2019-10-16 – 2019-10-17 (×2): 2 via ORAL
  Filled 2019-10-15 (×3): qty 2

## 2019-10-15 MED ORDER — IBUPROFEN 800 MG PO TABS: 800.0000 mg | ORAL_TABLET | Freq: Four times a day (QID) | ORAL | Status: DC

## 2019-10-15 MED ORDER — SCOPOLAMINE 1 MG/3DAYS TD PT72
MEDICATED_PATCH | TRANSDERMAL | Status: AC
  Filled 2019-10-15: qty 1

## 2019-10-15 MED ORDER — MORPHINE SULFATE (PF) 10 MG/ML IV SOLN
INTRAVENOUS | Status: DC | PRN
  Administered 2019-10-15: .5 mg via INTRAVENOUS
  Administered 2019-10-15: 3 mg via INTRAVENOUS

## 2019-10-15 MED ORDER — SIMETHICONE 80 MG PO CHEW: 80.0000 mg | CHEWABLE_TABLET | ORAL | Status: DC | PRN

## 2019-10-15 MED ORDER — SODIUM CHLORIDE 0.9 % IV SOLN
500.0000 mg | INTRAVENOUS | Status: DC
  Administered 2019-10-15: 500 mg via INTRAVENOUS

## 2019-10-15 MED ORDER — KETOROLAC TROMETHAMINE 30 MG/ML IJ SOLN
INTRAMUSCULAR | Status: AC
  Filled 2019-10-15: qty 1

## 2019-10-15 MED ORDER — OXYCODONE-ACETAMINOPHEN 5-325 MG PO TABS: 2.0000 | ORAL_TABLET | ORAL | Status: DC | PRN

## 2019-10-15 MED ORDER — OXYTOCIN 40 UNITS IN NORMAL SALINE INFUSION - SIMPLE MED
INTRAVENOUS | Status: AC
  Filled 2019-10-15: qty 1000

## 2019-10-15 MED ORDER — SODIUM CHLORIDE 0.9 % IV SOLN
2.0000 g | INTRAVENOUS | Status: DC
  Filled 2019-10-15: qty 2

## 2019-10-15 MED ORDER — HYDROMORPHONE HCL 1 MG/ML IJ SOLN: 1.0000 mg | INTRAMUSCULAR | Status: DC | PRN

## 2019-10-15 MED ORDER — ENOXAPARIN SODIUM 60 MG/0.6ML ~~LOC~~ SOLN
0.5000 mg/kg | SUBCUTANEOUS | Status: DC
  Administered 2019-10-16 – 2019-10-18 (×3): 60 mg via SUBCUTANEOUS
  Filled 2019-10-15 (×3): qty 0.6

## 2019-10-15 MED ORDER — CARBOPROST TROMETHAMINE 250 MCG/ML IM SOLN
INTRAMUSCULAR | Status: DC | PRN
  Administered 2019-10-15: 250 ug via INTRAMUSCULAR

## 2019-10-15 MED ORDER — PRENATAL MULTIVITAMIN CH
1.0000 | ORAL_TABLET | Freq: Every day | ORAL | Status: DC
  Administered 2019-10-16 – 2019-10-17 (×2): 1 via ORAL
  Filled 2019-10-15 (×3): qty 1

## 2019-10-15 MED ORDER — TRANEXAMIC ACID 1000 MG/10ML IV SOLN
INTRAVENOUS | Status: DC | PRN
  Administered 2019-10-15: 1000 mg via INTRAVENOUS

## 2019-10-15 MED ORDER — GABAPENTIN 300 MG PO CAPS
300.0000 mg | ORAL_CAPSULE | Freq: Two times a day (BID) | ORAL | Status: DC
  Administered 2019-10-15 – 2019-10-18 (×6): 300 mg via ORAL
  Filled 2019-10-15 (×6): qty 1

## 2019-10-15 MED ORDER — DEXAMETHASONE SODIUM PHOSPHATE 4 MG/ML IJ SOLN
INTRAMUSCULAR | Status: AC
  Filled 2019-10-15: qty 1

## 2019-10-15 MED ORDER — SODIUM CHLORIDE 0.9 % IV SOLN
INTRAVENOUS | Status: DC | PRN
  Administered 2019-10-15: 500 mg via INTRAVENOUS

## 2019-10-15 MED ORDER — DEXAMETHASONE SODIUM PHOSPHATE 4 MG/ML IJ SOLN
INTRAMUSCULAR | Status: DC | PRN
  Administered 2019-10-15: 4 mg via INTRAVENOUS

## 2019-10-15 MED ORDER — SODIUM CHLORIDE 0.9 % IV SOLN
INTRAVENOUS | Status: AC
  Filled 2019-10-15: qty 2

## 2019-10-15 MED ORDER — WITCH HAZEL-GLYCERIN EX PADS: 1.0000 "application " | MEDICATED_PAD | CUTANEOUS | Status: DC | PRN

## 2019-10-15 MED ORDER — SODIUM CHLORIDE 0.9 % IV SOLN
INTRAVENOUS | Status: AC
  Filled 2019-10-15: qty 500

## 2019-10-15 MED ORDER — EPHEDRINE SULFATE-NACL 50-0.9 MG/10ML-% IV SOSY
PREFILLED_SYRINGE | INTRAVENOUS | Status: DC | PRN
  Administered 2019-10-15 (×3): 20 mg via INTRAVENOUS
  Administered 2019-10-15: 25 mg via INTRAVENOUS

## 2019-10-15 MED ORDER — TRANEXAMIC ACID-NACL 1000-0.7 MG/100ML-% IV SOLN
INTRAVENOUS | Status: AC
  Filled 2019-10-15: qty 100

## 2019-10-15 MED ORDER — ONDANSETRON HCL 4 MG/2ML IJ SOLN
INTRAMUSCULAR | Status: AC
  Filled 2019-10-15: qty 2

## 2019-10-15 MED ORDER — TRAMADOL HCL 50 MG PO TABS: 50.0000 mg | ORAL_TABLET | Freq: Four times a day (QID) | ORAL | Status: DC | PRN

## 2019-10-15 MED ORDER — OXYTOCIN 40 UNITS IN NORMAL SALINE INFUSION - SIMPLE MED
INTRAVENOUS | Status: DC | PRN
  Administered 2019-10-15: 40 [IU] via INTRAVENOUS

## 2019-10-15 MED ORDER — MAGNESIUM SULFATE 40 GM/1000ML IV SOLN
1.0000 g/h | INTRAVENOUS | Status: DC
  Administered 2019-10-16: 1 g/h via INTRAVENOUS
  Filled 2019-10-15: qty 1000

## 2019-10-15 MED ORDER — MORPHINE SULFATE (PF) 0.5 MG/ML IJ SOLN
INTRAMUSCULAR | Status: AC
  Filled 2019-10-15: qty 10

## 2019-10-15 MED ORDER — PHENYLEPHRINE 40 MCG/ML (10ML) SYRINGE FOR IV PUSH (FOR BLOOD PRESSURE SUPPORT)
PREFILLED_SYRINGE | INTRAVENOUS | Status: AC
  Filled 2019-10-15: qty 20

## 2019-10-15 MED ORDER — ZOLPIDEM TARTRATE 5 MG PO TABS: 5.0000 mg | ORAL_TABLET | Freq: Every evening | ORAL | Status: DC | PRN

## 2019-10-15 MED ORDER — KETOROLAC TROMETHAMINE 30 MG/ML IJ SOLN: 30.0000 mg | Freq: Once | INTRAMUSCULAR | Status: DC

## 2019-10-15 MED ORDER — SIMETHICONE 80 MG PO CHEW
80.0000 mg | CHEWABLE_TABLET | ORAL | Status: DC
  Administered 2019-10-16 – 2019-10-17 (×2): 80 mg via ORAL
  Filled 2019-10-15 (×3): qty 1

## 2019-10-15 MED ORDER — SODIUM CHLORIDE 0.9 % IV SOLN: INTRAVENOUS | Status: DC | PRN

## 2019-10-15 MED ORDER — MENTHOL 3 MG MT LOZG: 1.0000 | LOZENGE | OROMUCOSAL | Status: DC | PRN

## 2019-10-15 MED ORDER — BUPIVACAINE HCL (PF) 0.5 % IJ SOLN
INTRAMUSCULAR | Status: DC | PRN
  Administered 2019-10-15: 10 mL

## 2019-10-15 MED ORDER — EPHEDRINE 5 MG/ML INJ
INTRAVENOUS | Status: AC
  Filled 2019-10-15: qty 20

## 2019-10-15 SURGICAL SUPPLY — 36 items
CHLORAPREP W/TINT 26ML (MISCELLANEOUS) ×4 IMPLANT
CLAMP CORD UMBIL (MISCELLANEOUS) IMPLANT
CLOTH BEACON ORANGE TIMEOUT ST (SAFETY) ×4 IMPLANT
DRAPE SHEET LG 3/4 BI-LAMINATE (DRAPES) ×4 IMPLANT
DRSG OPSITE POSTOP 4X10 (GAUZE/BANDAGES/DRESSINGS) ×4 IMPLANT
ELECT REM PT RETURN 9FT ADLT (ELECTROSURGICAL) ×4
ELECTRODE REM PT RTRN 9FT ADLT (ELECTROSURGICAL) ×2 IMPLANT
EXTRACTOR VACUUM M CUP 4 TUBE (SUCTIONS) IMPLANT
EXTRACTOR VACUUM M CUP 4' TUBE (SUCTIONS)
GLOVE BIOGEL PI IND STRL 7.0 (GLOVE) ×6 IMPLANT
GLOVE BIOGEL PI INDICATOR 7.0 (GLOVE) ×6
GLOVE ECLIPSE 7.0 STRL STRAW (GLOVE) ×4 IMPLANT
GOWN STRL REUS W/TWL LRG LVL3 (GOWN DISPOSABLE) ×8 IMPLANT
KIT ABG SYR 3ML LUER SLIP (SYRINGE) IMPLANT
LEGGING LITHOTOMY PAIR STRL (DRAPES) ×4 IMPLANT
NEEDLE HYPO 22GX1.5 SAFETY (NEEDLE) ×4 IMPLANT
NEEDLE HYPO 25X5/8 SAFETYGLIDE (NEEDLE) ×4 IMPLANT
NS IRRIG 1000ML POUR BTL (IV SOLUTION) ×4 IMPLANT
PACK C SECTION WH (CUSTOM PROCEDURE TRAY) ×4 IMPLANT
PAD ABD 7.5X8 STRL (GAUZE/BANDAGES/DRESSINGS) ×4 IMPLANT
PAD ABD 8X7 1/2 STERILE (GAUZE/BANDAGES/DRESSINGS) ×4 IMPLANT
PAD OB MATERNITY 4.3X12.25 (PERSONAL CARE ITEMS) ×4 IMPLANT
PENCIL SMOKE EVAC W/HOLSTER (ELECTROSURGICAL) ×4 IMPLANT
RTRCTR C-SECT PINK 25CM LRG (MISCELLANEOUS) IMPLANT
SPONGE GAUZE 4X4 12PLY (GAUZE/BANDAGES/DRESSINGS) ×8 IMPLANT
SUT PDS AB 0 CTX 36 PDP370T (SUTURE) IMPLANT
SUT PLAIN 2 0 XLH (SUTURE) IMPLANT
SUT VIC AB 0 CT1 27 (SUTURE) ×2
SUT VIC AB 0 CT1 27XBRD ANBCTR (SUTURE) ×2 IMPLANT
SUT VIC AB 0 CTX 36 (SUTURE) ×4
SUT VIC AB 0 CTX36XBRD ANBCTRL (SUTURE) ×4 IMPLANT
SUT VIC AB 4-0 KS 27 (SUTURE) ×4 IMPLANT
SYR CONTROL 10ML LL (SYRINGE) ×4 IMPLANT
TOWEL OR 17X24 6PK STRL BLUE (TOWEL DISPOSABLE) ×4 IMPLANT
TRAY FOLEY W/BAG SLVR 14FR LF (SET/KITS/TRAYS/PACK) ×4 IMPLANT
WATER STERILE IRR 1000ML POUR (IV SOLUTION) ×4 IMPLANT

## 2019-10-15 NOTE — Progress Notes (Signed)
Labor Progress Note Rachel Roy is a 34 y.o. G1P0000 at [redacted]w[redacted]d presented for IOL for severe preeclampsia.  S: Has been pushing for over 3 hours, mother is exhausted.  Wants to discuss options.  O: BP (!) 145/96 (BP Location: Right Arm)   Pulse 95   Temp 97.7 F (36.5 C) (Axillary)   Resp 18   Ht 5\' 7"  (1.702 m)   Wt 115.7 kg   LMP 02/10/2019   SpO2 (!) 89%   BMI 39.94 kg/m   Fetal Tracing:  Baby A Baseline: 120 Variability: moderate Accels: 15x15 Decels: none  Baby B Baseline: 110 Variability: moderate Accels: 15x15 Decels: none  Toco: q 3-6 minutes  CVE: Dilation: 10 Dilation Complete Date: 10/15/19 Dilation Complete Time: 0922 Effacement (%): 100 Cervical Position: Posterior Station: Plus 1 Presentation: Vertex Exam by:: caroline cnm   Labs: CBC Latest Ref Rng & Units 10/15/2019 10/14/2019 10/13/2019  WBC 4.0 - 10.5 K/uL 12.7(H) 13.0(H) 10.9(H)  Hemoglobin 12.0 - 15.0 g/dL 11.3(L) 12.0 9.5(L)  Hematocrit 36.0 - 46.0 % 36.3 38.0 30.2(L)  Platelets 150 - 400 K/uL 189 202 169   CMP Latest Ref Rng & Units 10/15/2019 10/13/2019 10/12/2019  Glucose 70 - 99 mg/dL 10/14/2019) 89 94  BUN 6 - 20 mg/dL 9 10 9   Creatinine 0.44 - 1.00 mg/dL 027(X) 4.12(I  Sodium 135 - 145 mmol/L 132(L) 138 140  Potassium 3.5 - 5.1 mmol/L 3.9 3.8 3.7  Chloride 98 - 111 mmol/L 101 110 105  CO2 22 - 32 mmol/L 20(L) 19(L) 20(L)  Calcium 8.9 - 10.3 mg/dL 7.0(L) 8.3(L) 9.3  Total Protein 6.5 - 8.1 g/dL 7.86) 4.8(L) 4.9(L)  Total Bilirubin 0.3 - 1.2 mg/dL 1.2 0.4 0.5  Alkaline Phos 38 - 126 U/L 198(H) 149(H) 148(H)  AST 15 - 41 U/L 36 47(H) 42(H)  ALT 0 - 44 U/L 47(H) 56(H) 41  Mag level 8.3 >>>No signs of toxicity. Magnesium sulfate infusion turned down to 1g/hr  A&P: 34 y.o. G1P0000 [redacted]w[redacted]d IOL preeclampsia, now with prolonged second stage. Category I FHR x 2. Discussed options of further pushing for at least one hour vs vacuum assistance vs cesarean delivery.   Risks of vacuum  assistance were discussed in detail, including but not limited to, bleeding, infection, damage to maternal tissues, fetal cephalohematoma/intracranial hemorrhage, inability to effect vaginal delivery of the head or shoulder dystocia that cannot be resolved by established maneuvers and need for emergency cesarean section.   Risks of cesarean section were also discussed with the patient included but were not limited to: bleeding which may require transfusion or reoperation; infection which may require antibiotics; injury to bowel, bladder, ureters or other surrounding organs; injury to the fetus; need for additional procedures including hysterectomy in the event of a life-threatening hemorrhage; placental abnormalities wth subsequent pregnancies, incisional problems, thromboembolic phenomenon and other postoperative/anesthesia complications.   Patient desires trial of vacuum assistance.  Will do this soon. OR and Anesthesiology team notified. NICU staff will be called for delivery.    32, MD 1:16 PM

## 2019-10-15 NOTE — Progress Notes (Signed)
Labor Progress Note Rachel Roy is a 34 y.o. G1P0000 at [redacted]w[redacted]d presented for IOL for severe preeclampsia. Twin A delivered via VAVD at 1335. Twin B still undelivered.  S: Patient is frustrated.  O: BP 124/80   Pulse 92   Temp (!) 97.1 F (36.2 C) (Axillary)   Resp 18   Ht 5\' 7"  (1.702 m)   Wt 115.7 kg   LMP 02/10/2019   SpO2 (!) 89%   BMI 39.94 kg/m  Fetal Tracing: Baby B Baseline: 110 Variability: moderate Accels: 15x15 Decels: a few ?late or variable Toco: q 3-8 minutes  CVE: Dilation: 6 Dilation Complete Date: 10/15/19 Dilation Complete Time: 0922 Effacement (%): 80 Cervical Position: Middle Station: -3 Presentation: Vertex Exam by:: [email or web address], CNM and Jodi Mourning, MD   Labs: CBC Latest Ref Rng & Units 10/15/2019 10/14/2019 10/13/2019  WBC 4.0 - 10.5 K/uL 12.7(H) 13.0(H) 10.9(H)  Hemoglobin 12.0 - 15.0 g/dL 11.3(L) 12.0 9.5(L)  Hematocrit 36.0 - 46.0 % 36.3 38.0 30.2(L)  Platelets 150 - 400 K/uL 189 202 169   CMP Latest Ref Rng & Units 10/15/2019 10/13/2019 10/12/2019  Glucose 70 - 99 mg/dL 10/14/2019) 89 94  BUN 6 - 20 mg/dL 9 10 9   Creatinine 0.44 - 1.00 mg/dL 295(J) 8.84(Z  Sodium 135 - 145 mmol/L 132(L) 138 140  Potassium 3.5 - 5.1 mmol/L 3.9 3.8 3.7  Chloride 98 - 111 mmol/L 101 110 105  CO2 22 - 32 mmol/L 20(L) 19(L) 20(L)  Calcium 8.9 - 10.3 mg/dL 7.0(L) 8.3(L) 9.3  Total Protein 6.5 - 8.1 g/dL 6.60) 4.8(L) 4.9(L)  Total Bilirubin 0.3 - 1.2 mg/dL 1.2 0.4 0.5  Alkaline Phos 38 - 126 U/L 198(H) 149(H) 148(H)  AST 15 - 41 U/L 36 47(H) 42(H)  ALT 0 - 44 U/L 47(H) 56(H) 41  Mag level 8.3 >>>No signs of toxicity. Magnesium sulfate infusion turned down to 1g/hr  A&P: 34 y.o. G1P0000 [redacted]w[redacted]d IOL severe preeclampsia, after VAVD of Twin A, now with failure of cervical dilation/descent of Twin B and concerning decelerations. Discussed options of further pushing vs cesarean delivery. Patient is very frustrated and feels "I cannot do it anymore". Desires  cesarean delivery. Risks re-reviewed with patient, she gave informed written consent for the procedure. Will do perineal laceration repair also while in OR. Anesthesia and OR aware. Preoperative prophylactic antibiotics and SCDs ordered on call to the OR.  To OR when ready.   NICU staff will be called for delivery.    32, MD 4:15 PM

## 2019-10-15 NOTE — Progress Notes (Signed)
Labor Progress Note Rachel Roy is a 34 y.o. G1P0000 at [redacted]w[redacted]d presented for IOL for severe preeclampsia.  S:  Feeling pressure. Has been resting.   O:  BP 133/89   Pulse 86   Temp (!) 97.5 F (36.4 C) (Oral)   Resp 16   Ht 5\' 7"  (1.702 m)   Wt 115.7 kg   LMP 02/10/2019   SpO2 (!) 89%   BMI 39.94 kg/m   Fetal Tracing:  Baby A  Baseline: 120 Variability: moderate Accels: 15x15 Decels: none  Baby B  Baseline: 120 Variability: moderate Accels: 15x15 Decels: none  Toco: q58m   CVE: Dilation: 6 Effacement (%): 80 Cervical Position: Posterior Station: 0 Presentation: Vertex Exam by:: [email or web address], MD   A&P: 34 y.o. G1P0000 [redacted]w[redacted]d IOL preeclampsia #Labor: No cervical change. Molding/Caput of twin A noted. Pitocin stopped and will plan to restart at 2 milli-units at 0130. Attempted to elevated Twin A and rotate. Anticipate VD; CS as appropriate. #Severe Pre-E: Currently with mild-range BP's. Cont Mag. Repeat CMP/CBC AM. Cont to monitor. Asymptomatic. #Pain: epidural #FWB: Cat I x2 #GBS negative  [redacted]w[redacted]d, MD 12:19 AM

## 2019-10-15 NOTE — Discharge Summary (Addendum)
Postpartum Discharge Summary     Patient Name: Rachel Roy DOB: April 21, 1986 MRN: [id number]  Date of admission: 10/11/2019 Delivering Provider:    Torrance, Frech [[id number]]  Ria Bush, Bayard Males [[id number]]  Verita Schneiders A    Date of discharge: 10/18/2019  Admitting diagnosis: Preeclampsia, third trimester [O14.93] Intrauterine pregnancy: [redacted]w[redacted]d    Secondary diagnosis:  Active Problems:   Supervision of high risk pregnancy, antepartum   Dichorionic diamniotic twin pregnancy   Rubella non-immune status, antepartum   S/P cesarean section   Severe preeclampsia, delivered   Vacuum-assisted vaginal delivery   Failure to progress in labor   PPH (postpartum hemorrhage)   Prolonged rupture of membranes, greater than 24 hours, delivered   Hyponatremia   AKI (acute kidney injury) ([place]   Hypocalcemia  Additional problems: None     Discharge diagnosis: Preterm Pregnancy Delivered, Preeclampsia (severe) and PPH                                                                                                Post partum procedures:blood transfusion  Augmentation: Pitocin, Cytotec and Foley Balloon  Complications: [id number]>7121FXand ROM>24 hours  Hospital course:  Induction of Labor With Vaginal Delivery   34y.o. yo G1P0102 at 341w2das admitted to the hospital 10/11/2019 for induction of labor.  Indication for induction: severe Pre-E.  Patient initially admitted to antepartum and then on the next day was noted to have severe-range pressures with rising LFT's and Cr. Patient had a complicated labor course as follows: Received Cytotec, foley balloon and Pitocin. SROM occurred. Received epidural. Patient ruptured > 24 hours and at 6 cm dilation for > 24 hours but finally progressed to complete. After 3 hours of pushing with minimal fetal descent of Twin A, Twin A was successfully delivered by vacuum-assist. Patient labored down with Twin B and  ultimately cervix 6 cm thereafter and at -3 station. Some late decelerations noted of Twin B now with failure of cervical dilation/descent in setting of severe Pre-E and prolonged rupture and induction. Twin B was delivered by C-section. Patient with 2nd degree tear after VAVD that was subsequently repaired in OR after c-section. After both deliveries, combined total EBL > 1000 mL.  Membrane Rupture Time/Date:    WiLakendra, Helling0[[id number]]6:42 AM    WiPhilip Aspen0[[id number]]6:42 AM  ,   WiMishelle, Hassan0[[id number]]10/14/2019    WiRuthene, Methvin0[[id number]]10/14/2019    Intrapartum Procedures: Episiotomy:    WiKaneshia, Cater0[[id number]]None [1]    WiEpifania, Littrell0[[id number]]                                          Lacerations:     WiIlean, Spradlin0[[id number]]2nd degree [3];Perineal [11];Sulcus [9]    WiZaidee, Rion0[[id number]]   Patient had delivery of two Viable infants. Information for the  patient's newborn:  Avannah, Decker [[id number]]  Delivery Method: Vaginal, Vacuum (Extractor)(Filed from Delivery Summary)  Information for the patient's newborn:  Faline, Langer [[id number]]        Naveh, Rickles [[id number]]  10/15/2019    Cloma, Rahrig [[id number]]  10/15/2019   Details of delivery can be found in separate delivery note. Magnesium continued for 24 hour post-partum. Labs and BP's monitored post-partum. Due to decreasing hemoglobin, she received one unit blood transfusion. Patient desired Depo for contraception. Patient is discharged home 10/18/19. Delivery time:    Khamila, Bassinger [[id number]]  1:35 PM    Adna, Nofziger [[id number]]  4:42 PM    Magnesium Sulfate received: Yes BMZ received: Yes Rhophylac:No MMR:Yes Transfusion:Yes  Physical exam  Vitals:   10/17/19 1949 10/17/19 1950 10/17/19 2249 10/18/19 0602  BP: (!) 153/87    136/68  Pulse: 96  (!) 120 88  Resp: 17   17  Temp: 98.2 F (36.8 C)   98.5 F (36.9 C)  TempSrc: Oral   Oral  SpO2: 99% 98%  100%  Weight:      Height:       General: alert, cooperative and no distress Lochia: appropriate Uterine Fundus: firm Incision: Healing well with no significant drainage, No significant erythema, Dressing is clean, dry, and intact DVT Evaluation: No evidence of DVT seen on physical exam. Negative Homan's sign. No cords or calf tenderness. Labs: Lab Results  Component Value Date   WBC 14.6 (H) 10/18/2019   HGB 6.2 (LL) 10/18/2019   HCT 19.5 (L) 10/18/2019   MCV 78.3 (L) 10/18/2019   PLT 289 10/18/2019   CMP Latest Ref Rng & Units 10/18/2019  Glucose 70 - 99 mg/dL 89  BUN 6 - 20 mg/dL 14  Creatinine 0.44 - 1.00 mg/dL 1.14(H)  Sodium 135 - 145 mmol/L 139  Potassium 3.5 - 5.1 mmol/L 3.7  Chloride 98 - 111 mmol/L 106  CO2 22 - 32 mmol/L 24  Calcium 8.9 - 10.3 mg/dL 7.5(L)  Total Protein 6.5 - 8.1 g/dL -  Total Bilirubin 0.3 - 1.2 mg/dL -  Alkaline Phos 38 - 126 U/L -  AST 15 - 41 U/L -  ALT 0 - 44 U/L -    Discharge instruction: per After Visit Summary and "Baby and Me Booklet".  After visit meds:  Allergies as of 10/18/2019   No Known Allergies     Medication List    STOP taking these medications   aspirin EC 81 MG tablet   calcium carbonate 500 MG chewable tablet Commonly known as: TUMS - dosed in mg elemental calcium     TAKE these medications   DULoxetine HCl 40 MG Cpep Take 40 mg by mouth daily.   oxyCODONE-acetaminophen 5-325 MG tablet Commonly known as: PERCOCET/ROXICET Take 1 tablet by mouth every 4 (four) hours as needed for severe pain ((when tolerating fluids)).   prenatal multivitamin Tabs tablet Take 1 tablet by mouth at bedtime.       Diet: routine diet  Activity: Advance as tolerated. Pelvic rest for 6 weeks.   Outpatient follow up:4 weeks Follow up Appt: Future Appointments  Date Time Provider [place]  10/23/2019  9:10 AM Florian Buff, MD [facility]  11/20/2019  1:30 PM Christin Fudge, CNM [facility]  01/08/2020 10:40 AM Norman Clay, MD [facility] None   Follow up Visit: Follow-up Information    [facility] Follow up.   Specialty: Obstetrics and Gynecology Contact  information: [place] [phone]           PP message sent by Dr. Harolyn Rutherford.   Newborn Data:   Elesa, Garman [[id number]]  Live born female  Birth Weight: 5 lb 10.7 oz (2570 g) APGAR: 5, 7  Newborn Delivery   Birth date/time: 10/15/2019 13:35:00 Delivery type: Vaginal, Vacuum (Extractor)       Ariyonna, Twichell [[id number]]  Live born female  Birth Weight: 6 lb 1.7 oz (2770 g) APGAR: 4, 7  Newborn Delivery   Birth date/time: 10/15/2019 16:42:00 Delivery type: C-Section, Low Transverse C-section categorization: Primary      Baby Feeding: Breast Disposition:NICU   10/18/2019 Truett Mainland, DO

## 2019-10-15 NOTE — Plan of Care (Signed)
  Problem: Clinical Measurements: Goal: Diagnostic test results will improve Outcome: Progressing Goal: Cardiovascular complication will be avoided Outcome: Progressing   Problem: Activity: Goal: Risk for activity intolerance will decrease Outcome: Progressing   

## 2019-10-15 NOTE — Transfer of Care (Signed)
Immediate Anesthesia Transfer of Care Note  Patient: Rachel Roy  Procedure(s) Performed: CESAREAN SECTION (N/A ) SUTURE REPAIR PERINEAL LACERATION (N/A Perineum)  Patient Location: PACU  Anesthesia Type:Epidural  Level of Consciousness: awake, alert  and oriented  Airway & Oxygen Therapy: Patient Spontanous Breathing  Post-op Assessment: Report given to RN and Post -op Vital signs reviewed and stable  Post vital signs: Reviewed and stable  Last Vitals:  Vitals Value Taken Time  BP 123/84 10/15/19 1754  Temp    Pulse 85 10/15/19 1800  Resp 16 10/15/19 1800  SpO2 100 % 10/15/19 1800  Vitals shown include unvalidated device data.  Last Pain:  Vitals:   10/15/19 1600  TempSrc:   PainSc: 0-No pain      Patients Stated Pain Goal: 3 (10/12/19 0757)  Complications: No apparent anesthesia complications

## 2019-10-15 NOTE — Op Note (Signed)
[name] PROCEDURE DATE: 10/15/2019  PREOPERATIVE DIAGNOSES: Intrauterine pregnancy at [redacted]w[redacted]d weeks gestation; recurrent fetal heart rate decelerations;  failure to progress: arrest of descent and failure to progress: arrest of dilation after vacuum assisted vaginal delivery of Twin A; multiple day induction of labor for severe preeclampsia  POSTOPERATIVE DIAGNOSES: The same  PROCEDURE: Primary Low Transverse Cesarean Section and Repair of Second Degree Perineal Laceration  SURGEON:  Dr. Verita Schneiders  ASSISTANT:  Dr. Barrington Ellison  ANESTHESIOLOGY TEAM: Anesthesiologist: Lidia Collum, MD; Freddrick March, MD CRNA: Rayvon Char, CRNA  INDICATIONS: Rachel Roy is a 34 y.o. G1P0102 at [redacted]w[redacted]d here for cesarean section secondary to the indications listed under preoperative diagnoses; please see preoperative note for further details.  The risks of cesarean section were discussed with the patient including but were not limited to: bleeding which may require transfusion or reoperation; infection which may require antibiotics; injury to bowel, bladder, ureters or other surrounding organs; injury to the fetus; need for additional procedures including hysterectomy in the event of a life-threatening hemorrhage; placental abnormalities wth subsequent pregnancies, incisional problems, thromboembolic phenomenon and other postoperative/anesthesia complications.   The patient concurred with the proposed plan, giving informed written consent for the procedure.    FINDINGS:  Viable female infant in cephalic presentation.  Apgars 4/7/8; cord arterial pH sent and pending at time of note.  Clear amniotic fluid.  Intact placenta, three vessel cord.  Normal uterus, fallopian tubes and ovaries bilaterally. There was significant uterine atony after delivery; patient did receive preoperative TXA, and intraoperative Methergine and Hemabate.  ANESTHESIA: Epidural  ESTIMATED BLOOD LOSS: 1000 ml (had 500  ml during VAVD) SPECIMENS: Placenta sent to pathology COMPLICATIONS: None immediate  PROCEDURE IN DETAIL:  The patient preoperatively received intravenous antibiotics and had sequential compression devices applied to her lower extremities.  She was then taken to the operating room where the epidural anesthesia was dosed up to surgical level and was found to be adequate. She was then placed in a dorsal supine position with a leftward tilt, and prepped and draped in a sterile manner.  A foley catheter was placed into her bladder and attached to constant gravity.  After an adequate timeout was performed, a Pfannenstiel skin incision was made with scalpel and carried through to the underlying layer of fascia. The fascia was incised in the midline, and this incision was extended bilaterally using the Mayo scissors.  Kocher clamps were applied to the superior aspect of the fascial incision and the underlying rectus muscles were dissected off bluntly.  A similar process was carried out on the inferior aspect of the fascial incision. The rectus muscles were separated in the midline and the peritoneum was entered bluntly. The Alexis self-retaining retractor was introduced into the abdominal cavity.  Attention was turned to the lower uterine segment where a low transverse hysterotomy was made with a scalpel and extended bilaterally bluntly.  The infant was successfully delivered, the cord was clamped and cut after one minute, and the infant was handed over to the awaiting neonatology team. Uterine massage was then administered, and the placenta delivered intact with a three-vessel cord. The uterus was then cleared of clots and debris. Uterine atony was noted, managed with the aforenmentioned uterotonics.  The hysterotomy was closed with 0 Vicryl in a running locked fashion, and an imbricating layer was also placed with 0 Vicryl.   The pelvis was cleared of all clots and debris. Hemostasis was confirmed on all surfaces.   The retractor  was removed.  The peritoneum was closed with a 2-0 Vicryl running stitch. The fascia was then closed using 0 PDS in a running fashion.  The subcutaneous layer was irrigated, reapproximated with 2-0 plain gut interrupted stitches, and the skin was closed with a 4-0 Vicryl subcuticular stitch.  The incision was then closed with a honeycomb followed by a pressure dressing.  Attention was then turned to the perineum.  On pelvic exam, patient was noted to have a right sided sulcal tear extending about 6 cm from introitus and a second degree perineal laceration.  These were repaired in the usual fashion using 2-0 Vicryl; 0 vicryl interrupted stitches were used for the crown stitch and also used to reinforce the perineal body. Good hemostasis was noted.  The patient tolerated the procedures well. Sponge, instrument and needle counts were correct x 3.  She was taken to the recovery room in stable condition.    Rachel Collins, MD, FACOG Obstetrician & Gynecologist, [facility] for [employer], [facility]

## 2019-10-15 NOTE — Progress Notes (Signed)
Labor Progress Note Rachel Roy is a 34 y.o. G1P0000 at [redacted]w[redacted]d presented for IOL for severe preeclampsia.  S:  Patient resting in bed. Per RN, patient is becoming frustrated.  O:  BP 128/75   Pulse 89   Temp (!) 97.5 F (36.4 C) (Oral)   Resp 16   Ht 5\' 7"  (1.702 m)   Wt 115.7 kg   LMP 02/10/2019   SpO2 (!) 89%   BMI 39.94 kg/m   Fetal Tracing:  Baby A  Baseline: 120 Variability: moderate Accels: 15x15 Decels: none  Baby B  Baseline: 120 Variability: moderate Accels: 15x15 Decels: none  Toco: infrequent   CVE: Dilation: 6 Effacement (%): 80 Cervical Position: Posterior Station: 0 Presentation: Vertex Exam by:: [email or web address], MD   A&P: 34 y.o. G1P0000 [redacted]w[redacted]d IOL preeclampsia #Labor: Will defer cervical exam at this time as ctx are so infrequent and patient not feeling anything differently. Pit restarted and now at 10. Cont Pit titration. Will recheck in next 1-2 hours and discuss other options and whether patient would like to continue to pursue vaginal delivery. Will discuss with Dr. [redacted]w[redacted]d at that time.  #Severe Pre-E: Currently with mild-range BP's. Cont Mag. Repeat CMP/CBC AM. Cont to monitor. Asymptomatic. #Pain: epidural #FWB: Cat I x2 #GBS negative  Marice Potter, MD 3:58 AM

## 2019-10-15 NOTE — Progress Notes (Addendum)
Labor Progress Note Erik Nessel is a 34 y.o. G1P0000 at [redacted]w[redacted]d presented for IOL for severe preeclampsia.  S:  Comfortable with epidural. Discussed options and and assessed patient and FOB's feeling in process.   O:  BP 125/67   Pulse 93   Temp 98.2 F (36.8 C) (Oral)   Resp 15   Ht 5\' 7"  (1.702 m)   Wt 115.7 kg   LMP 02/10/2019   SpO2 (!) 89%   BMI 39.94 kg/m   Fetal Tracing:  Baby A  Baseline: 120 Variability: moderate Accels: 15x15 Decels: none  Baby B  Baseline: 120 Variability: moderate Accels: 15x15 Decels: none  Toco: infrequent   CVE: Dilation: 6 Effacement (%): 80 Cervical Position: Posterior Station: 0 Presentation: Vertex Exam by:: [email or web address], MD   A&P: 34 y.o. G1P0000 [redacted]w[redacted]d IOL preeclampsia #Labor: Deferred cervical exam as ctx infrequent and patient not feeling pressure. Discussed situation and that patient has not made cervical change for ~ 12 hours and almost ruptured for 24 hours. Patient would like to continue with Pitocin titration for a few more hours as long as FHR's look good. Discussed risk of infection with longer ROM and patient understanding. Cervical exam as needed for maternal/fetal indication.  #Severe Pre-E: Currently with mild-range BP's. Cont Mag. Repeat CMP/CBC AM. Cont to monitor. Asymptomatic. #Pain: epidural #FWB: Cat I x2 #GBS negative  [redacted]w[redacted]d, MD 5:10 AM  Addendum: D/w Dr. Joselyn Arrow. Will draw AM labs including Mag level and possibly decrease Mag to lower rate if needed. Anticipate VD; CS as appropriate.  6:30 [place] [id number] [facility] Family Medicine Fellow, [facility] for [facility], [facility]

## 2019-10-15 NOTE — Progress Notes (Signed)
Labor Progress Note Rachel Roy is a 34 y.o. G1P0000 at [redacted]w[redacted]d presented for IOL for severe preeclampsia.  S:  Comfortable with epidural. Frustrated with slow process.  O:  BP (!) 148/85   Pulse 97   Temp 97.6 F (36.4 C) (Oral)   Resp 18   Ht 5\' 7"  (1.702 m)   Wt 115.7 kg   LMP 02/10/2019   SpO2 (!) 89%   BMI 39.94 kg/m   Fetal Tracing:  Baby A Baseline: 120 Variability: moderate Accels: 15x15 Decels: none  Baby B Baseline: 110 Variability: moderate Accels: 15x15 Decels: none  Toco: infrequent  CVE: Dilation: Lip/rim Effacement (%): 100 Cervical Position: Posterior Station: Plus 1 Presentation: Vertex Exam by:: dr. [email or web address] Reducible thin lip  Labs: CBC Latest Ref Rng & Units 10/15/2019 10/14/2019 10/13/2019  WBC 4.0 - 10.5 K/uL 12.7(H) 13.0(H) 10.9(H)  Hemoglobin 12.0 - 15.0 g/dL 11.3(L) 12.0 9.5(L)  Hematocrit 36.0 - 46.0 % 36.3 38.0 30.2(L)  Platelets 150 - 400 K/uL 189 202 169   CMP Latest Ref Rng & Units 10/15/2019 10/13/2019 10/12/2019  Glucose 70 - 99 mg/dL 10/14/2019) 89 94  BUN 6 - 20 mg/dL 9 10 9   Creatinine 0.44 - 1.00 mg/dL 333(L) 4.56(Y  Sodium 135 - 145 mmol/L 132(L) 138 140  Potassium 3.5 - 5.1 mmol/L 3.9 3.8 3.7  Chloride 98 - 111 mmol/L 101 110 105  CO2 22 - 32 mmol/L 20(L) 19(L) 20(L)  Calcium 8.9 - 10.3 mg/dL 7.0(L) 8.3(L) 9.3  Total Protein 6.5 - 8.1 g/dL 5.63) 4.8(L) 4.9(L)  Total Bilirubin 0.3 - 1.2 mg/dL 1.2 0.4 0.5  Alkaline Phos 38 - 126 U/L 198(H) 149(H) 148(H)  AST 15 - 41 U/L 36 47(H) 42(H)  ALT 0 - 44 U/L 47(H) 56(H) 41  Mag level 8.3 >>>No signs of toxicity. Magnesium sulfate infusion turned down to 1g/hr  A&P: 34 y.o. G1P0000 [redacted]w[redacted]d IOL preeclampsia, now in second stage. #Labor: Will start pushing soon. Patient cautioned about risk of still needing cesarean section due to failure of descent, NRFHT or other indication. Risk of PPH reviewed; will give TXA 1g IV now and have uterotonics ready to be administered.  #Severe  Pre-E: No severe range BP's. Cont Mag at 1g/hr.. Asymptomatic. #Pain: epidural #FWB: Cat I x2. NICU to be present at delivery. #GBS negative  32, MD 9:03 AM

## 2019-10-15 NOTE — Plan of Care (Signed)
CRITICAL VALUE ALERT  Critical Value:  Magnesium of 8.0  Date & Time Notied:  10/05/19 at 0800  Provider Notified: Dr. Morene Antu  Orders Received/Actions taken: awaiting orders

## 2019-10-16 DIAGNOSIS — N179 Acute kidney failure, unspecified: Secondary | ICD-10-CM

## 2019-10-16 DIAGNOSIS — E871 Hypo-osmolality and hyponatremia: Secondary | ICD-10-CM

## 2019-10-16 HISTORY — DX: Acute kidney failure, unspecified: N17.9

## 2019-10-16 LAB — COMPREHENSIVE METABOLIC PANEL
ALT: 26 U/L (ref 0–44)
ALT: 25 U/L (ref 0–44)
AST: 26 U/L (ref 15–41)
AST: 24 U/L (ref 15–41)
Albumin: 1.2 g/dL — ABNORMAL LOW (ref 3.5–5.0)
Albumin: 1.3 g/dL — ABNORMAL LOW (ref 3.5–5.0)
Alkaline Phosphatase: 135 U/L — ABNORMAL HIGH (ref 38–126)
Alkaline Phosphatase: 149 U/L — ABNORMAL HIGH (ref 38–126)
Anion gap: 9 (ref 5–15)
Anion gap: 9 (ref 5–15)
BUN: 14 mg/dL (ref 6–20)
BUN: 18 mg/dL (ref 6–20)
CO2: 19 mmol/L — ABNORMAL LOW (ref 22–32)
CO2: 21 mmol/L — ABNORMAL LOW (ref 22–32)
Calcium: 6.3 mg/dL — CL (ref 8.9–10.3)
Calcium: 6.6 mg/dL — ABNORMAL LOW (ref 8.9–10.3)
Chloride: 100 mmol/L (ref 98–111)
Chloride: 96 mmol/L — ABNORMAL LOW (ref 98–111)
Creatinine, Ser: 1.67 mg/dL — ABNORMAL HIGH (ref 0.44–1.00)
Creatinine, Ser: 1.85 mg/dL — ABNORMAL HIGH (ref 0.44–1.00)
GFR calc Af Amer: 46 mL/min — ABNORMAL LOW (ref >60)
GFR calc Af Amer: 41 mL/min — ABNORMAL LOW (ref >60)
GFR calc non Af Amer: 40 mL/min — ABNORMAL LOW (ref >60)
GFR calc non Af Amer: 35 mL/min — ABNORMAL LOW (ref >60)
Glucose, Bld: 112 mg/dL — ABNORMAL HIGH (ref 70–99)
Glucose, Bld: 106 mg/dL — ABNORMAL HIGH (ref 70–99)
Potassium: 4 mmol/L (ref 3.5–5.1)
Potassium: 4.1 mmol/L (ref 3.5–5.1)
Sodium: 128 mmol/L — ABNORMAL LOW (ref 135–145)
Sodium: 126 mmol/L — ABNORMAL LOW (ref 135–145)
Total Bilirubin: 0.7 mg/dL (ref 0.3–1.2)
Total Bilirubin: 0.8 mg/dL (ref 0.3–1.2)
Total Protein: 3.8 g/dL — ABNORMAL LOW (ref 6.5–8.1)
Total Protein: 4.5 g/dL — ABNORMAL LOW (ref 6.5–8.1)

## 2019-10-16 LAB — CBC
HCT: 22.8 % — ABNORMAL LOW (ref 36.0–46.0)
Hemoglobin: 7.1 g/dL — ABNORMAL LOW (ref 12.0–15.0)
MCH: 24.7 pg — ABNORMAL LOW (ref 26.0–34.0)
MCHC: 31.1 g/dL (ref 30.0–36.0)
MCV: 79.2 fL — ABNORMAL LOW (ref 80.0–100.0)
Platelets: 137 10*3/uL — ABNORMAL LOW (ref 150–400)
RBC: 2.88 MIL/uL — ABNORMAL LOW (ref 3.87–5.11)
RDW: 15.3 % (ref 11.5–15.5)
WBC: 16.4 10*3/uL — ABNORMAL HIGH (ref 4.0–10.5)
nRBC: 0 % (ref 0.0–0.2)

## 2019-10-16 LAB — SODIUM, URINE, RANDOM: Sodium, Ur: 18 mmol/L

## 2019-10-16 LAB — MAGNESIUM
Magnesium: 10.3 mg/dL (ref 1.7–2.4)
Magnesium: 6.9 mg/dL (ref 1.7–2.4)

## 2019-10-16 LAB — OSMOLALITY: Osmolality: 271 mOsm/kg — ABNORMAL LOW (ref 275–295)

## 2019-10-16 LAB — OSMOLALITY, URINE: Osmolality, Ur: 206 mOsm/kg — ABNORMAL LOW (ref 300–900)

## 2019-10-16 LAB — CREATININE, URINE, RANDOM: Creatinine, Urine: 62.67 mg/dL

## 2019-10-16 MED ORDER — FUROSEMIDE 10 MG/ML IJ SOLN
20.0000 mg | Freq: Once | INTRAMUSCULAR | Status: AC
  Administered 2019-10-16: 20 mg via INTRAVENOUS
  Filled 2019-10-16: qty 2

## 2019-10-16 MED ORDER — SODIUM CHLORIDE 0.9 % IV SOLN
510.0000 mg | Freq: Once | INTRAVENOUS | Status: AC
  Administered 2019-10-16: 510 mg via INTRAVENOUS
  Filled 2019-10-16: qty 17

## 2019-10-16 MED ORDER — CALCIUM GLUCONATE-NACL 1-0.675 GM/50ML-% IV SOLN
1.0000 g | Freq: Once | INTRAVENOUS | Status: AC
  Administered 2019-10-16: 1000 mg via INTRAVENOUS
  Filled 2019-10-16: qty 50

## 2019-10-16 MED ORDER — CHOLECALCIFEROL 10 MCG (400 UNIT) PO TABS
400.0000 [IU] | ORAL_TABLET | Freq: Every day | ORAL | Status: AC
  Administered 2019-10-16 – 2019-10-17 (×2): 400 [IU] via ORAL
  Filled 2019-10-16 (×3): qty 1

## 2019-10-16 MED ORDER — CALCIUM CARBONATE 1250 (500 CA) MG PO TABS
1.0000 | ORAL_TABLET | Freq: Every day | ORAL | Status: DC
  Filled 2019-10-16: qty 1

## 2019-10-16 MED ORDER — MEDROXYPROGESTERONE ACETATE 150 MG/ML IM SUSP
150.0000 mg | Freq: Once | INTRAMUSCULAR | Status: AC
  Administered 2019-10-17: 150 mg via INTRAMUSCULAR
  Filled 2019-10-16: qty 1

## 2019-10-16 NOTE — Anesthesia Postprocedure Evaluation (Signed)
Anesthesia Post Note  Patient: Rachel Roy  Procedure(s) Performed: CESAREAN SECTION (N/A ) SUTURE REPAIR PERINEAL LACERATION (N/A Perineum)     Patient location during evaluation: PACU Anesthesia Type: Epidural Level of consciousness: awake and alert Pain management: pain level controlled Vital Signs Assessment: post-procedure vital signs reviewed and stable Respiratory status: spontaneous breathing, nonlabored ventilation and respiratory function stable Cardiovascular status: stable Postop Assessment: no headache, no backache and epidural receding Anesthetic complications: no    Last Vitals:  Vitals:   10/15/19 2332 10/15/19 2359  BP: 114/75   Pulse: 71   Resp: 17 18  Temp: 36.7 C   SpO2: 100%     Last Pain:  Vitals:   10/15/19 2359  TempSrc:   PainSc: 0-No pain   Pain Goal: Patients Stated Pain Goal: 3 (10/12/19 0757)                 Romie Jumper L Jolana Runkles

## 2019-10-16 NOTE — Anesthesia Postprocedure Evaluation (Signed)
Anesthesia Post Note  Patient: Rachel Roy  Procedure(s) Performed: CESAREAN SECTION (N/A ) SUTURE REPAIR PERINEAL LACERATION (N/A Perineum)     Patient location during evaluation: Mother Baby Anesthesia Type: Epidural Level of consciousness: awake and alert Pain management: pain level controlled Vital Signs Assessment: post-procedure vital signs reviewed and stable Respiratory status: spontaneous breathing, nonlabored ventilation and respiratory function stable Cardiovascular status: stable Postop Assessment: no headache, no backache and epidural receding Anesthetic complications: no    Last Vitals:  Vitals:   10/16/19 0520 10/16/19 0610  BP: 105/70   Pulse: 62   Resp:  16  Temp:    SpO2:      Last Pain:  Vitals:   10/16/19 0610  TempSrc:   PainSc: 0-No pain   Pain Goal: Patients Stated Pain Goal: 3 (10/12/19 0757)                 Fanny Dance

## 2019-10-16 NOTE — Lactation Note (Signed)
This note was copied from a baby's chart. Lactation Consultation Note  Patient Name: Rachel Roy Date: 10/16/2019 Reason for consult: Initial assessment;Late-preterm 34-36.6wks;Infant < 6lbs;Primapara;1st time breastfeeding;NICU baby;Multiple gestation  P2 mother whose twins are now 52 hours old.  Twin "A" is 5 lbs 10.7 oz and twin "B" is 6 lbs 1/7 oz.  These are 35+2 week twins with a CGA of 35+3 weeks and in the NICU.  Mother had finished breakfast and is ready to begin pumping. Pump parts, assembly, disassembly and cleaning reviewed.  Observed mother pumping with the #24 flange size which is appropriate at this time.  Spent considerable amount of time discussing pumping, LPTI, STS, hand expression, expectations for producing colostrum and milk supply and being diligent about pumping consistently.  Mother will pump every 2 1/2-3 hours with a goal of 8-12 times/24 hours.  Colostrum container provided and milk storage times reviewed.  Father will ask for labels at the next NICU visit and will be available to transport any EBM mother obtains to the NICU.  He has been to the NICU twice and will take mother after her 1230 pumping.  Reviewed milk supply chart in booklet and LPTI handout.  Both parents very receptive to learning and asked questions.  Encouraged mother to bring pump parts to the NICU and pump at babys' bedside when visiting.  Mother took a breast feeding virtual class and has a DEBP for home use.  Mother stated that her pediatrician's office has a Advertising copywriter.  I encouraged her to use this service after discharge to assist with any questions or concerns mother may experience.  Mother very interested in pursuing this option.  Parents will call as needed.  Also informed parents that lactation is available to assist in the NICU after mother has been discharged.     Maternal Data Formula Feeding for Exclusion: No Has patient been taught Hand Expression?: Yes Does the  patient have breastfeeding experience prior to this delivery?: No  Feeding    LATCH Score                   Interventions    Lactation Tools Discussed/Used Pump Review: Setup, frequency, and cleaning;Milk Storage Initiated by:: Laureen Ochs Date initiated:: 10/16/19   Consult Status Consult Status: Follow-up Date: 10/17/19 Follow-up type: In-patient    Charon Akamine R Kalis Friese 10/16/2019, 10:06 AM

## 2019-10-16 NOTE — Lactation Note (Signed)
This note was copied from a baby's chart. Lactation Consultation Note  Patient Name: Rachel Roy [id number] Date: 10/16/2019 Reason for consult: Initial assessment  LC Initial Visit:  NT in room speaking with mother when I arrived.  Mother was not interested in visiting with me at this time.  She stated she is tired and would like to get her breakfast first.  I offered to visit with her after breakfast and mother will inform her RN when she is ready for a return visit.  "Providing Breast Milk For Your Baby in the NICU" booklet and lactation services brochure left at bedside.  Will ask RN to set up DEBP for mother as soon as she is willing to begin pumping and to call me when mother is ready.    Consult Status Consult Status: Follow-up Date: 10/16/19 Follow-up type: In-patient    [name] 10/16/2019, 8:18 AM

## 2019-10-16 NOTE — Progress Notes (Signed)
CSW met with FOB at infant's bedside. When CSW arrived, FOB was observing twins. CSW explained CSW role and provided FOB's with CSW's contact information. Per FOB, MOB was on the 1st floor resting.   CSW attempted to meet with MOB at her bedside. When CSW arrived, MOB was asleep.  MOB agreed that CSW can return at a later time to complete clinical  assessment.   Rachel Roy, MSW, LCSW Clinical Social Work [phone] 

## 2019-10-16 NOTE — Lactation Note (Signed)
This note was copied from a baby's chart. Lactation Consultation Note  Patient Name: Rachel Roy [id number] Date: 10/16/2019  P2, 13 hour twins. RN informed LC mom is very tired and would like LC services later today she has not started using a DEBP yet.    Maternal Data    Feeding    LATCH Score                   Interventions    Lactation Tools Discussed/Used     Consult Status      Danelle Earthly 10/16/2019, 6:23 AM

## 2019-10-16 NOTE — Progress Notes (Addendum)
Subjective: PPD#1 s/p VAVD Twin A: POD#1 s/p Cesarean Delivery Twin B Patient reports feeling okay. Pain is well-controlled. She has been tolerating PO. Foley still in place and has not yet ambulated. Minimal lochia. Babies are in NICU but doing well.   Objective: Vital signs in last 24 hours: Temp:  [96.5 F (35.8 C)-98.3 F (36.8 C)] 98.3 F (36.8 C) (02/01 0503) Pulse Rate:  [62-101] 62 (02/01 0520) Resp:  [13-22] 16 (02/01 0610) BP: (105-148)/(66-119) 105/70 (02/01 0520) SpO2:  [97 %-100 %] 100 % (01/31 2332)  Physical Exam:  General: alert, cooperative, appears stated age and no distress Lochia: appropriate Uterine Fundus: firm Incision: healing well, no significant drainage, no dehiscence DVT Evaluation: No evidence of DVT seen on physical exam. Calf/Ankle edema is present.  Recent Labs    10/15/19 1836 10/16/19 0430  HGB 8.8* 7.1*  HCT 28.5* 22.8*    Assessment/Plan: Status post Cesarean section and VAVD. Severe Pre-E.  Doing well postoperatively.  Continue current care. IV iron ordered  MMR indicated  Breastfeeding Desires depo; ordered Will discontinue Mag this AM as level > 10 in setting of AKI Vitals stable; BP's currently well-controlled without medication Plan for Foley removal today and monitor IUP; encourage ambulation  Cont PTA Cymbalta for anxiety AKI: patient appears hypervolemic; Lasix 20 mg IV ordered and repeat CMP tonight; urine studies ordered if AKI worsening. NSAID discontinued.  Hyponatremia: again likely due to hypervolemia; Lasix as above and PRN thereafter; repeat CMP tonight to follow sodium and Cr Elevated LFT's: resolved on this AM CMP Hypocalcemia: Ca 6.3. Mild symptoms including paresthesia of hands. Vitamin D and IV Calcium ordered which will also help lower Mag level. Follow with evening CMP.   [place] 10/16/2019, 7:07 AM

## 2019-10-16 NOTE — Addendum Note (Signed)
Addendum  created 10/16/19 0813 by Renford Dills, CRNA   Clinical Note Signed

## 2019-10-17 LAB — BASIC METABOLIC PANEL
Anion gap: 11 (ref 5–15)
BUN: 17 mg/dL (ref 6–20)
CO2: 22 mmol/L (ref 22–32)
Calcium: 7.4 mg/dL — ABNORMAL LOW (ref 8.9–10.3)
Chloride: 104 mmol/L (ref 98–111)
Creatinine, Ser: 1.41 mg/dL — ABNORMAL HIGH (ref 0.44–1.00)
GFR calc Af Amer: 57 mL/min — ABNORMAL LOW (ref >60)
GFR calc non Af Amer: 49 mL/min — ABNORMAL LOW (ref >60)
Glucose, Bld: 92 mg/dL (ref 70–99)
Potassium: 4.1 mmol/L (ref 3.5–5.1)
Sodium: 137 mmol/L (ref 135–145)

## 2019-10-17 LAB — UREA NITROGEN, URINE: Urea Nitrogen, Ur: 74 mg/dL

## 2019-10-17 LAB — SURGICAL PATHOLOGY

## 2019-10-17 MED ORDER — FUROSEMIDE 10 MG/ML IJ SOLN
20.0000 mg | Freq: Two times a day (BID) | INTRAMUSCULAR | Status: DC
  Administered 2019-10-17 – 2019-10-18 (×3): 20 mg via INTRAVENOUS
  Filled 2019-10-17 (×3): qty 2

## 2019-10-17 MED ORDER — DULOXETINE HCL 20 MG PO CPEP
40.0000 mg | ORAL_CAPSULE | Freq: Every day | ORAL | Status: DC
  Administered 2019-10-17: 40 mg via ORAL
  Filled 2019-10-17: qty 2

## 2019-10-17 NOTE — Clinical Social Work Maternal (Signed)
CLINICAL SOCIAL WORK MATERNAL/CHILD NOTE  Patient Details  Name: Danaiya Steadman MRN: [id number] Date of Birth: [birth year]09/1986  Date:  10/17/2019  Clinical Social Worker Initiating Note:  Laurey Arrow Date/Time: Initiated:  10/17/19/1117     Child's Name:  Mary-Dava and Glennon Mac   Biological Parents:  Mother, Father   Need for Interpreter:  None   Reason for Referral:  Behavioral Health Concerns(hx of anxiety)   Address:  [address] 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  Strengths:  Ability to meet basic needs , Home prepared for child , Understanding of illness, Pediatrician chosen, Compliance with medical plan , Psychotropic Medications   Psychotropic Medications:  [place]      Pediatrician:    [place] area  Pediatrician List:   [facility]      Pediatrician Fax Number:    Risk Factors/Current Problems:  Mental Health Concerns    Cognitive State:  Able to Concentrate , Alert , Insightful , Linear Thinking     Mood/Affect:  Happy , Relaxed , Interested , Comfortable , Bright    CSW Assessment: CSW met with MOB and FOB at twins bedside to complete an assessment for MH hx. When CSW arrived, MOB was pumping and FOB was holding TwinB; TwinA was asleep in her isolette. CSW offered to returned at a later time but the couple declined. CSW explained CSW's role and MOB gave CSW permission to complete clinical assessment while FOB was present. FOB appeared very supportive of MOB and was engaged with CSW during the duration of the assessment. MOB was polite, easy to engage, and receptive to meeting with CSW.  CSW asked about MOB's thoughts and feelings regarding the twins NICU admission. MOB shared feeling traumatized and openly shared her delivery story. MOB was tearful explaining her labor experience and having the Twins admitted to the NICU.  CSW validated and normalized MOB's thoughts and feeling and shared some other emotions and feelings that MOB may experience during the postpartum period. MOB and FOB expressed gratitude towards CSW for validating MOB's feelings and giving FOB tips to support MOB during the postpartum period. CSW provided education regarding the baby blues period vs. perinatal mood disorders, discussed treatment and gave resources for mental health follow up if concerns arise.  CSW  recommends self-evaluation during the postpartum time period using the New Mom Checklist from Postpartum Progress and encouraged MOB to contact a medical professional if symptoms are noted at any time.  MOB reported having a good support team and communicated feeling comfortable seeking help if help is needed. MOB presented with insight and awareness and did not demonstrate an acute symptoms. MOB acknowledged being prescribed Cymbalta and reported being compliant with medication regiment. Per MOB, MOB was not receiving her medication while being inpatient; CSW agreed to follow-up with MOB's bedside nurse to pursue if  medication can be given to [facility] while she remains inpatient (MOB's bedside nurse agreed to look into MOB receiving her medication while inpatient). CSW assessed for SI and HI And MOB denied them both.  MOB expressed feeling attached and bonded with twins and feeling prepared to parent.    CSW provided review of Sudden Infant Death Syndrome (SIDS) precautions.    MOB and FOB agreed to CSW following-up with family while infant remains in NICU.   CSW Plan/Description:  Psychosocial Support and Ongoing Assessment of Needs, Sudden Infant Death Syndrome (SIDS) Education, Perinatal Mood and Anxiety Disorder (PMADs) Education, Other Patient/Family Education   Laurey Arrow, MSW, LCSW Clinical Social Work [phone]   Dimple Nanas, [place] 10/17/2019, 11:32 AM

## 2019-10-17 NOTE — Progress Notes (Signed)
Subjective: Postpartum Day 2: Cesarean Delivery Patient reports incisional pain, tolerating PO and no problems voiding.    Objective: Vital signs in last 24 hours: Temp:  [97.8 F (36.6 C)-98.1 F (36.7 C)] 98 F (36.7 C) (02/02 0500) Pulse Rate:  [62-72] 71 (02/02 0500) Resp:  [16-18] 16 (02/02 0500) BP: (111-129)/(68-79) 119/68 (02/02 0500) SpO2:  [95 %-100 %] 99 % (02/02 0500)  Physical Exam:  General: no distress Lochia: appropriate Uterine Fundus: firm Incision: healing well, no significant drainage DVT Evaluation: No evidence of DVT seen on physical exam.  Recent Labs    10/15/19 1836 10/16/19 0430  HGB 8.8* 7.1*  HCT 28.5* 22.8*    Assessment/Plan: Status post Cesarean section. Doing well postoperatively.  Continue current care.  Scheryl Darter 10/17/2019, 7:37 AM

## 2019-10-17 NOTE — Lactation Note (Signed)
This note was copied from a baby's chart. Lactation Consultation Note  Patient Name: Aimie Wagman [id number] Date: 10/17/2019  Babies are 44 hours old in the NICU.  Mom is pumping every 3 hours.  She is not obtaining colostrum yet.  Reassured and discussed milk coming to volume.  Mom has a pump at home for use after discharge.  Encouraged to call for questions or concerns.   Maternal Data    Feeding Feeding Type: Donor Breast Milk  LATCH Score                   Interventions    Lactation Tools Discussed/Used     Consult Status      Huston Foley 10/17/2019, 9:38 AM

## 2019-10-18 LAB — BASIC METABOLIC PANEL
Anion gap: 9 (ref 5–15)
BUN: 14 mg/dL (ref 6–20)
CO2: 24 mmol/L (ref 22–32)
Calcium: 7.5 mg/dL — ABNORMAL LOW (ref 8.9–10.3)
Chloride: 106 mmol/L (ref 98–111)
Creatinine, Ser: 1.14 mg/dL — ABNORMAL HIGH (ref 0.44–1.00)
GFR calc Af Amer: >60 mL/min (ref >60)
GFR calc non Af Amer: >60 mL/min (ref >60)
Glucose, Bld: 89 mg/dL (ref 70–99)
Potassium: 3.7 mmol/L (ref 3.5–5.1)
Sodium: 139 mmol/L (ref 135–145)

## 2019-10-18 LAB — CBC
HCT: 19.5 % — ABNORMAL LOW (ref 36.0–46.0)
Hemoglobin: 6.2 g/dL — CL (ref 12.0–15.0)
MCH: 24.9 pg — ABNORMAL LOW (ref 26.0–34.0)
MCHC: 31.8 g/dL (ref 30.0–36.0)
MCV: 78.3 fL — ABNORMAL LOW (ref 80.0–100.0)
Platelets: 289 10*3/uL (ref 150–400)
RBC: 2.49 MIL/uL — ABNORMAL LOW (ref 3.87–5.11)
RDW: 15.8 % — ABNORMAL HIGH (ref 11.5–15.5)
WBC: 14.6 10*3/uL — ABNORMAL HIGH (ref 4.0–10.5)
nRBC: 1 % — ABNORMAL HIGH (ref 0.0–0.2)

## 2019-10-18 LAB — HEMOGLOBIN AND HEMATOCRIT, BLOOD
HCT: 26.6 % — ABNORMAL LOW (ref 36.0–46.0)
Hemoglobin: 8.4 g/dL — ABNORMAL LOW (ref 12.0–15.0)

## 2019-10-18 LAB — PREPARE RBC (CROSSMATCH)

## 2019-10-18 MED ORDER — SODIUM CHLORIDE 0.9% IV SOLUTION: Freq: Once | INTRAVENOUS | Status: AC

## 2019-10-18 MED ORDER — OXYCODONE-ACETAMINOPHEN 5-325 MG PO TABS: 1.0000 | ORAL_TABLET | ORAL | 0 refills | Status: DC | PRN

## 2019-10-18 NOTE — Lactation Note (Signed)
This note was copied from a baby's chart. Lactation Consultation Note  Patient Name: Rachel Roy BZJIR'C Date: 10/18/2019  Babies are 96 hours old in the NICU.  Mom is pumping every 3 hours and has seen a few drops.  Mom is receiving blood transfusion today.  She was able to allow babies to lick and learn yesterday.  Encouraged to call for assist/concerns prn.  Instructed to change pump setting to standard when obtaining 20 mls of milk.  No other concerns.   Maternal Data    Feeding Feeding Type: Donor Breast Milk  LATCH Score                   Interventions    Lactation Tools Discussed/Used     Consult Status      Huston Foley 10/18/2019, 9:26 AM

## 2019-10-18 NOTE — Progress Notes (Addendum)
CRITICAL VALUE ALERT  Critical Value:  Hemoglobin 6.2  Date & Time Notified:  10/18/19 @ 0635  Provider Notified: Dr. Adrian Blackwater  Orders Received/Actions taken: Provider notified and aware. No orders received.

## 2019-10-18 NOTE — Discharge Instructions (Signed)
Cesarean Delivery, Care After This sheet gives you information about how to care for yourself after your procedure. Your health care provider may also give you more specific instructions. If you have problems or questions, contact your health care provider. What can I expect after the procedure? After the procedure, it is common to have:  A small amount of blood or clear fluid coming from the incision.  Some redness, swelling, and pain in your incision area.  Some abdominal pain and soreness.  Vaginal bleeding (lochia). Even though you did not have a vaginal delivery, you will still have vaginal bleeding and discharge.  Pelvic cramps.  Fatigue. You may have pain, swelling, and discomfort in the tissue between your vagina and your anus (perineum) if:  Your C-section was unplanned, and you were allowed to labor and push.  An incision was made in the area (episiotomy) or the tissue tore during attempted vaginal delivery. Follow these instructions at home: Incision care   Follow instructions from your health care provider about how to take care of your incision. Make sure you: ? Wash your hands with soap and water before you change your bandage (dressing). If soap and water are not available, use hand sanitizer. ? If you have a dressing, change it or remove it as told by your health care provider. ? Leave stitches (sutures), skin staples, skin glue, or adhesive strips in place. These skin closures may need to stay in place for 2 weeks or longer. If adhesive strip edges start to loosen and curl up, you may trim the loose edges. Do not remove adhesive strips completely unless your health care provider tells you to do that.  Check your incision area every day for signs of infection. Check for: ? More redness, swelling, or pain. ? More fluid or blood. ? Warmth. ? Pus or a bad smell.  Do not take baths, swim, or use a hot tub until your health care provider says it's okay. Ask your health  care provider if you can take showers.  When you cough or sneeze, hug a pillow. This helps with pain and decreases the chance of your incision opening up (dehiscing). Do this until your incision heals. Medicines  Take over-the-counter and prescription medicines only as told by your health care provider.  If you were prescribed an antibiotic medicine, take it as told by your health care provider. Do not stop taking the antibiotic even if you start to feel better.  Do not drive or use heavy machinery while taking prescription pain medicine. Lifestyle  Do not drink alcohol. This is especially important if you are breastfeeding or taking pain medicine.  Do not use any products that contain nicotine or tobacco, such as cigarettes, e-cigarettes, and chewing tobacco. If you need help quitting, ask your health care provider. Eating and drinking  Drink at least 8 eight-ounce glasses of water every day unless told not to by your health care provider. If you breastfeed, you may need to drink even more water.  Eat high-fiber foods every day. These foods may help prevent or relieve constipation. High-fiber foods include: ? Whole grain cereals and breads. ? Brown rice. ? Beans. ? Fresh fruits and vegetables. Activity   If possible, have someone help you care for your baby and help with household activities for at least a few days after you leave the hospital.  Return to your normal activities as told by your health care provider. Ask your health care provider what activities are safe for   you.  Rest as much as possible. Try to rest or take a nap while your baby is sleeping.  Do not lift anything that is heavier than 10 lbs (4.5 kg), or the limit that you were told, until your health care provider says that it is safe.  Talk with your health care provider about when you can engage in sexual activity. This may depend on your: ? Risk of infection. ? How fast you heal. ? Comfort and desire to  engage in sexual activity. General instructions  Do not use tampons or douches until your health care provider approves.  Wear loose, comfortable clothing and a supportive and well-fitting bra.  Keep your perineum clean and dry. Wipe from front to back when you use the toilet.  If you pass a blood clot, save it and call your health care provider to discuss. Do not flush blood clots down the toilet before you get instructions from your health care provider.  Keep all follow-up visits for you and your baby as told by your health care provider. This is important. Contact a health care provider if:  You have: ? A fever. ? Bad-smelling vaginal discharge. ? Pus or a bad smell coming from your incision. ? Difficulty or pain when urinating. ? A sudden increase or decrease in the frequency of your bowel movements. ? More redness, swelling, or pain around your incision. ? More fluid or blood coming from your incision. ? A rash. ? Nausea. ? Little or no interest in activities you used to enjoy. ? Questions about caring for yourself or your baby.  Your incision feels warm to the touch.  Your breasts turn red or become painful or hard.  You feel unusually sad or worried.  You vomit.  You pass a blood clot from your vagina.  You urinate more than usual.  You are dizzy or light-headed. Get help right away if:  You have: ? Pain that does not go away or get better with medicine. ? Chest pain. ? Difficulty breathing. ? Blurred vision or spots in your vision. ? Thoughts about hurting yourself or your baby. ? New pain in your abdomen or in one of your legs. ? A severe headache.  You faint.  You bleed from your vagina so much that you fill more than one sanitary pad in one hour. Bleeding should not be heavier than your heaviest period. Summary  After the procedure, it is common to have pain at your incision site, abdominal cramping, and slight bleeding from your vagina.  Check  your incision area every day for signs of infection.  Tell your health care provider about any unusual symptoms.  Keep all follow-up visits for you and your baby as told by your health care provider. This information is not intended to replace advice given to you by your health care provider. Make sure you discuss any questions you have with your health care provider. Document Revised: 03/09/2018 Document Reviewed: 03/09/2018 Elsevier Patient Education  2020 [employer]  

## 2019-10-19 LAB — TYPE AND SCREEN
ABO/RH(D): AB POS
Antibody Screen: NEGATIVE
Unit division: 0

## 2019-10-19 LAB — BPAM RBC
Blood Product Expiration Date: 202103082359
ISSUE DATE / TIME: 202102030815
Unit Type and Rh: 8400

## 2019-10-20 ENCOUNTER — Ambulatory Visit: Payer: Self-pay

## 2019-10-20 NOTE — Lactation Note (Signed)
This note was copied from a baby's chart. Lactation Consultation Note  Patient Name: Rachel Roy WZLYT'S Date: 10/20/2019  Twins are five days in the NICU. They are 36 weeks PMA.  Mom is recovering from a C/S and also required a blood transfusion post op.  She is doing well drinking water during the day and trying to eat 3 meals per day.  Mom is pumping every 3 hours during the day and sleeping during the night.  Recommended pumping during the night at 4-5 hours.  She is currently filling one colostrum container per pumping.  Reassured that milk production may be delayed.  Encouraged to call for assist prn.   Maternal Data    Feeding Feeding Type: Donor Breast Milk Nipple Type: Nfant Extra Slow Flow (gold)  LATCH Score                   Interventions    Lactation Tools Discussed/Used     Consult Status      Huston Foley 10/20/2019, 2:46 PM

## 2019-10-23 ENCOUNTER — Encounter: Payer: Self-pay | Admitting: Obstetrics & Gynecology

## 2019-10-23 ENCOUNTER — Ambulatory Visit (INDEPENDENT_AMBULATORY_CARE_PROVIDER_SITE_OTHER): Payer: 59 | Admitting: Obstetrics & Gynecology

## 2019-10-23 ENCOUNTER — Other Ambulatory Visit: Payer: Self-pay

## 2019-10-23 VITALS — BP 128/80 | HR 82

## 2019-10-23 DIAGNOSIS — Z98891 History of uterine scar from previous surgery: Secondary | ICD-10-CM

## 2019-10-23 NOTE — Progress Notes (Signed)
Patient ID: Torah Pinnock, female   DOB: May 25, 1986, 34 y.o.   MRN: [id number]  HPI: Patient returns for routine postoperative follow-up having undergone primary Caesarean section on 10/15/19.  The patient's immediate postoperative recovery has been unremarkable. Since hospital discharge the patient reports doing well, a little sad with the kids still being in the NICU.   Current Outpatient Medications: .  DULoxetine HCl 40 MG CPEP, Take 40 mg by mouth daily., Disp: 90 capsule, Rfl: 1 .  Prenatal Vit-Fe Fumarate-FA (PRENATAL MULTIVITAMIN) TABS tablet, Take 1 tablet by mouth at bedtime., Disp: , Rfl:  .  oxyCODONE-acetaminophen (PERCOCET/ROXICET) 5-325 MG tablet, Take 1 tablet by mouth every 4 (four) hours as needed for severe pain ((when tolerating fluids)). (Patient not taking: Reported on 10/23/2019), Disp: 20 tablet, Rfl: 0  No current facility-administered medications for this visit.    Blood pressure 128/80, pulse 82, last menstrual period 02/10/2019, currently breastfeeding.  Physical Exam: Incision clean dry intact Abdomen soft non tender Normal exam  Diagnostic Tests:   Pathology:   Impression: S/p primary Caesarean section  Plan: Routine post op care  Follow up: 4  weeks  Lazaro Arms, MD

## 2019-10-27 ENCOUNTER — Ambulatory Visit: Payer: Self-pay

## 2019-10-27 NOTE — Lactation Note (Signed)
This note was copied from a baby's chart. Lactation Consultation Note  Patient Name: Rachel Roy JJOAC'Z Date: 10/27/2019 Reason for consult: Follow-up assessment;Late-preterm 34-36.6wks;Primapara;1st time breastfeeding;NICU baby;Infant < 6lbs;Multiple gestation  P2 mother whose infant twins are now 62 days old and in the NICU.  The twins are 35+2 weeks with a CGA of 37+0 weeks.    RN requested latch assistance.  Baby girl "A" was awake and alert when I arrived.  Mother was going to attempt to latch her to the breast for the first time.  Offered to assist and mother accepted.  Mother's breasts are soft and non tender and nipples are everted and intact.  Mother has had a breast reduction and has been pumping every three hours round the clock.  She is obtaining up to 15 mls per pumping session.  Mother has been doing breast massage and compressions during pumping.  She is also doing hand expression.  Praised her for her efforts and encouraged her to continue with her routine.    Mother was interested in trying the football hold for the first latch.  Asked her to hand express drops and attempted to latch baby.  After two attempts baby latched but pushed back and became irritable.  Removed her from the breast and allowed her to calm by sucking on my gloved finger.  Attempted a second time with the same results.  Offered to try using a NS and mother willing to try.  #20 NS provided and taught mother the proper technique to place shield effectively at the breast.  Latched baby again and this time she began to suck with gentle stimulation.  Mother felt a tug, however, she only continued for a few sucks before stopping.  Used Similac 24 to entice her to latch and continue sucking.  Using a curved tip syringe I placed a few drops in her mouth with the NS and she began to suck.  Continued providing drops and, with the drops, she continued to latch and suck on/off for 10 minutes.  She began to tire very  quickly and I suggested mother place her STS and let her rest.  After removing her from the NS there was mother's EBM in the NS.  Mother excited to see this.  Discussed realistic breast feeding expectations for her baby and asked her to practice breast feeding when baby shows interest.  Discussed ways to promote milk supply and mother will call for [facility] assistance as needed.  RN updated.   Maternal Data Formula Feeding for Exclusion: No Has patient been taught Hand Expression?: Yes Does the patient have breastfeeding experience prior to this delivery?: No  Feeding Feeding Type: Breast Milk with Formula added  LATCH Score Latch: Repeated attempts needed to sustain latch, nipple held in mouth throughout feeding, stimulation needed to elicit sucking reflex.  Audible Swallowing: None  Type of Nipple: Everted at rest and after stimulation  Comfort (Breast/Nipple): Soft / non-tender  Hold (Positioning): Assistance needed to correctly position infant at breast and maintain latch.  LATCH Score: 6  Interventions Interventions: Breast feeding basics reviewed;Assisted with latch;Skin to skin;Breast massage;Hand express;Breast compression;DEBP;Position options;Support pillows  Lactation Tools Discussed/Used     Consult Status Consult Status: PRN Date: 10/27/19 Follow-up type: Call as needed    Rachel Roy Rachel Roy 10/27/2019, 3:22 PM

## 2019-10-31 ENCOUNTER — Ambulatory Visit: Payer: Self-pay

## 2019-10-31 NOTE — Lactation Note (Signed)
This note was copied from a baby's chart. Lactation Consultation Note  Patient Name: Wynema Garoutte QIWLN'L Date: 10/31/2019 Reason for consult: Follow-up assessment;Late-preterm 34-36.6wks;Primapara;1st time breastfeeding;Multiple gestation;NICU baby;Infant < 6lbs;Breast reduction;Other (Comment)(per mom breast reduction was 10 years ago. Post MagSo4, RN called to request LC assist for latch)  Baby is 88 weeks old ( 37 4/7 PMA )  Baby wide awake when LC arrived .  Per mom has been using a NS #20 .  LC 1st assisted to attempted to latch without the NS and baby took 5- 6 strong sucks and released. LC reviewed application of the #20 NS and assisted to latch the baby on the left breast / cross cradle with depth and baby fed for 10 mins and released.  Milk noted in the Nipple Shield and the bottom portion.  After baby released LC tried again to latch without and baby only took a few sucks and released.  LC reassured mom the latch went well for the age of the baby and baby opened wide.  LC asked mom about her milk supply and mentioned she had only been pumping off 20 ml total off her breast at a pumping session. Mom also mentioned in the beginning of pumping she was not waking up at night or going long stretches and was only pumping while awake.  LC  Reviewed supply and demand and the importance of consistent pumping.  LC recommended pumping 2-3 hours during the day and evening and last pumping of the day power pump ( 20 mins on 10 mins off over 60 mins or 10 mins on 10 mins off over 60 mins ) . Allow herself to sleep 5 hours and when she get up power pump for 60 mins or the 2nd pumping of the day power pump.  Also google low milk supply and check out [email or web address]. and check out receipe for  Lactation cookies. Consider obtaining the Mother's love ( 3 herbs combo and follow the direction of the dose ). Mom mentioned she was already using the Mother love tea.  NICU RN was given the [facility] consult report  at the bedside.    Maternal Data Has patient been taught Hand Expression?: Yes  Feeding Feeding Type: Breast Fed Nipple Type: Dr. Lorne Skeens  LATCH Score Latch: Grasps breast easily, tongue down, lips flanged, rhythmical sucking.  Audible Swallowing: A few with stimulation(increase to 2)  Type of Nipple: Everted at rest and after stimulation(areola compressible)  Comfort (Breast/Nipple): Soft / non-tender  Hold (Positioning): Assistance needed to correctly position infant at breast and maintain latch.  LATCH Score: 8  Interventions Interventions: Breast feeding basics reviewed;Assisted with latch;Skin to skin;Breast massage;Hand express;Reverse pressure;Breast compression;Adjust position;Support pillows;Position options  Lactation Tools Discussed/Used Tools: Pump;Nipple Shields(tried 1st without the NS - few sucks and baby released) Nipple shield size: 20(good fit for mom and baby) Breast pump type: Double-Electric Breast Pump   Consult Status Consult Status: PRN Date: (baby in NICU) Follow-up type: In-patient    Matilde Sprang Vaden Becherer 10/31/2019, 6:41 PM

## 2019-11-06 ENCOUNTER — Ambulatory Visit: Payer: Self-pay

## 2019-11-06 NOTE — Lactation Note (Signed)
This note was copied from a baby's chart. Lactation Consultation Note  Patient Name: Rachel Roy [id number]'P Date: 11/06/2019 Reason for consult: Follow-up assessment;NICU baby;Multiple gestation   Twins now 66 weeks old in NICU.  Mother has hx breast reduction. Referred her to [email or web address].  Encouraged hands on pumping which mother is aware. Praised her for her efforts. She is pumping approx 15 ml per session. Mother will call to make appt for lactation assistance as needed. . Currently SLP is coming to evaluate feeding.     Maternal Data    Feeding Feeding Type: Formula Nipple Type: Dr. Irving Burton level 4  LATCH Score                   Interventions Interventions: DEBP  Lactation Tools Discussed/Used     Consult Status Consult Status: Follow-up Date: 11/09/19 Follow-up type: In-patient    Dahlia Byes [facility] 11/06/2019, 2:52 PM

## 2019-11-07 ENCOUNTER — Ambulatory Visit: Payer: Self-pay

## 2019-11-07 NOTE — Lactation Note (Signed)
This note was copied from a baby's chart. Lactation Consultation Note  Patient Name: Rachel Roy [id number]'G Date: 11/07/2019  Babies are three weeks old in the NICU.  They are 38.4 PMA.  Mom has a history of a breast reduction.  She is pumping every 3 hours during the day and once at night.  She obtains 15 mls total.  Mom reports feeling discouraged and "over it".  Offered a lot of support and praise for pumping.  Mom still wants to provide breastmilk at this time and knows that any breastmilk is beneficial for babies.  I let her know we would support her with any decision she feels is best for her.  She is not putting babies to breast much because she wants them to bottle feed so they can go home.  Reviewed outpatient services.  Encouraged to call for assist/concerns prn.   Maternal Data    Feeding Feeding Type: Formula Nipple Type: Dr. Levert Feinstein [facility]  LATCH Score                   Interventions    Lactation Tools Discussed/Used     Consult Status      Huston Foley 11/07/2019, 1:56 PM

## 2019-11-20 ENCOUNTER — Encounter: Payer: Self-pay | Admitting: Advanced Practice Midwife

## 2019-11-20 ENCOUNTER — Ambulatory Visit (INDEPENDENT_AMBULATORY_CARE_PROVIDER_SITE_OTHER): Payer: 59 | Admitting: Advanced Practice Midwife

## 2019-11-20 ENCOUNTER — Other Ambulatory Visit: Payer: Self-pay

## 2019-11-20 VITALS — BP 129/89 | HR 63 | Ht 67.0 in | Wt 210.0 lb

## 2019-11-20 DIAGNOSIS — Z1389 Encounter for screening for other disorder: Secondary | ICD-10-CM

## 2019-11-20 DIAGNOSIS — Z1332 Encounter for screening for maternal depression: Secondary | ICD-10-CM

## 2019-11-20 DIAGNOSIS — Z331 Pregnant state, incidental: Secondary | ICD-10-CM

## 2019-11-20 MED ORDER — MEDROXYPROGESTERONE ACETATE 150 MG/ML IM SUSP: 150.0000 mg | INTRAMUSCULAR | 3 refills | Status: DC

## 2019-11-20 NOTE — Progress Notes (Signed)
Rachel Roy is a 34 y.o. who presents for a postpartum visit. She is 6 weeks postpartum following a VAD of twin A and CS of Twin B (delivered Twin A after 3 hours of pushing, cx went back to 6cms, had late decels and after 3 more hours, CS was done for FTD). I have fully reviewed the prenatal and intrapartum course. The delivery was at 35.2 gestational weeks. IOL for Severe PreE Anesthesia: epidural. Postpartum course has been uneventful. Her babies have just come home (one last week and one yesterday).  Baby is feeding by bottle. Bleeding: no bleeding. Bowel function is normal. Bladder function is normal. Patient is not sexually active. Contraception method is Depo-Provera injections. Postpartum depression screening: negative. Sees behavioral health prn and for meds.   Current Outpatient Medications:  .  DULoxetine HCl 40 MG CPEP, Take 40 mg by mouth daily., Disp: 90 capsule, Rfl: 1 .  Prenatal Vit-Fe Fumarate-FA (PRENATAL MULTIVITAMIN) TABS tablet, Take 1 tablet by mouth at bedtime., Disp: , Rfl:   Review of Systems   Constitutional: Negative for fever and chills Eyes: Negative for visual disturbances Respiratory: Negative for shortness of breath, dyspnea Cardiovascular: Negative for chest pain or palpitations  Gastrointestinal: Negative for vomiting, diarrhea and constipation Genitourinary: Negative for dysuria and urgency Musculoskeletal: Negative for back pain, joint pain, myalgias  Neurological: Negative for dizziness and headaches    Objective:     Vitals:   11/20/19 1345  BP: 129/89  Pulse: 63   General:  alert, cooperative and no distress   Breasts:  negative  Lungs: Normal respiratory effort  Heart:  regular rate and rhythm  Abdomen: Soft, nontender well healed incision   Vulva:  normal  Vagina: normal vagina  Cervix:  closed  Corpus: Well involuted     Rectal Exam: Small hemorrhoids        Assessment:    normal postpartum exam.  Plan:   1. Contraception:  Depo-Provera injections 2. Follow up in: 4/28 ish for next depo or as needed.
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
## 2020-01-08 ENCOUNTER — Encounter (HOSPITAL_COMMUNITY): Payer: Self-pay | Admitting: Psychiatry

## 2020-01-08 ENCOUNTER — Telehealth (INDEPENDENT_AMBULATORY_CARE_PROVIDER_SITE_OTHER): Payer: PRIVATE HEALTH INSURANCE | Admitting: Psychiatry

## 2020-01-08 ENCOUNTER — Other Ambulatory Visit: Payer: Self-pay

## 2020-01-08 DIAGNOSIS — F411 Generalized anxiety disorder: Secondary | ICD-10-CM | POA: Diagnosis not present

## 2020-01-08 NOTE — Patient Instructions (Signed)
1. Continueduloxetine 40 mg daily  2.Next appointment: 7/12 at 8:50

## 2020-01-10 ENCOUNTER — Ambulatory Visit: Payer: 59

## 2020-01-11 ENCOUNTER — Ambulatory Visit: Payer: 59

## 2020-01-16 ENCOUNTER — Ambulatory Visit (INDEPENDENT_AMBULATORY_CARE_PROVIDER_SITE_OTHER): Payer: 59 | Admitting: *Deleted

## 2020-01-16 ENCOUNTER — Other Ambulatory Visit: Payer: Self-pay

## 2020-01-16 DIAGNOSIS — Z3042 Encounter for surveillance of injectable contraceptive: Secondary | ICD-10-CM

## 2020-01-16 MED ORDER — MEDROXYPROGESTERONE ACETATE 150 MG/ML IM SUSP
150.0000 mg | Freq: Once | INTRAMUSCULAR | Status: AC
  Administered 2020-01-16: 150 mg via INTRAMUSCULAR

## 2020-01-16 NOTE — Progress Notes (Signed)
   NURSE VISIT- INJECTION  SUBJECTIVE:  Rachel Roy is a 34 y.o. G24P0102 female here for a Depo Provera for contraception/period management. She is a GYN patient.   OBJECTIVE:  There were no vitals taken for this visit.  Appears well, in no apparent distress  Injection administered in: Left deltoid  No orders of the defined types were placed in this encounter.   ASSESSMENT: GYN patient Depo Provera for contraception/period management PLAN: Follow-up: in 11-13 weeks for next Depo   Stoney Bang  01/16/2020 3:53 PM

## 2020-01-31 IMAGING — US US MFM FETAL BPP W/ NONSTRESS ADDL GEST
1 series · 15 of 28 positions shown · non-contrast
Comparison: none

[Series 1: us mfm fetal bpp w/ nonstress addl gest · 87 acquisitions, 15 frames shown]
[im 1/87]
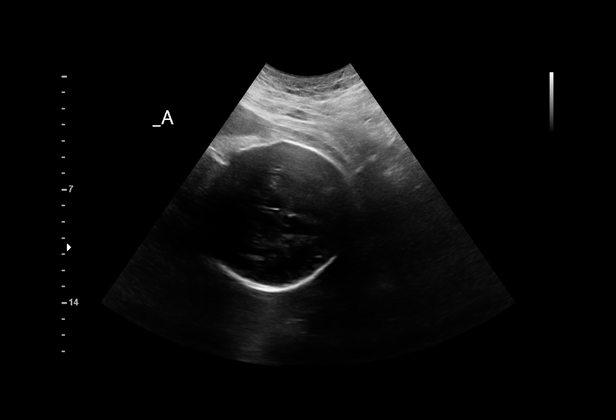
[im 7/87]
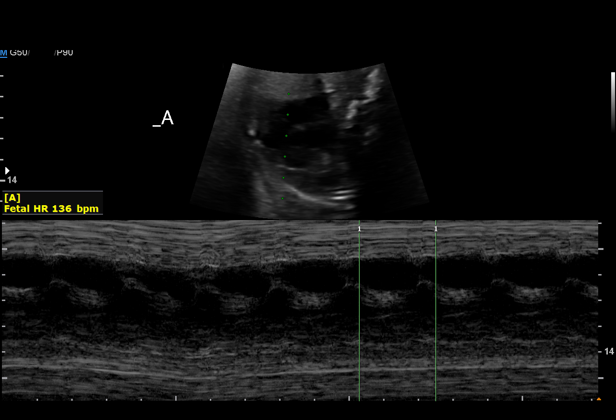
[im 13/87]
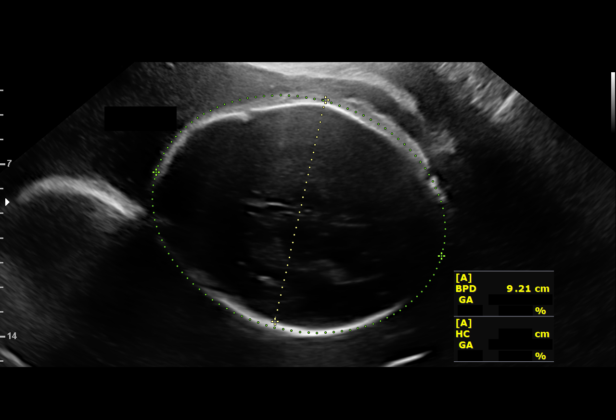
[im 20/87]
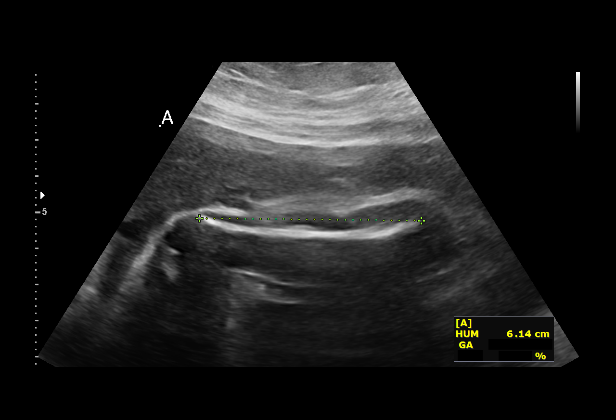
[im 26/87]
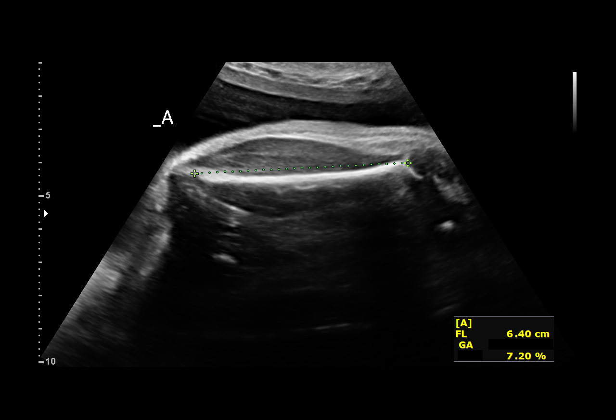
[im 32/87]
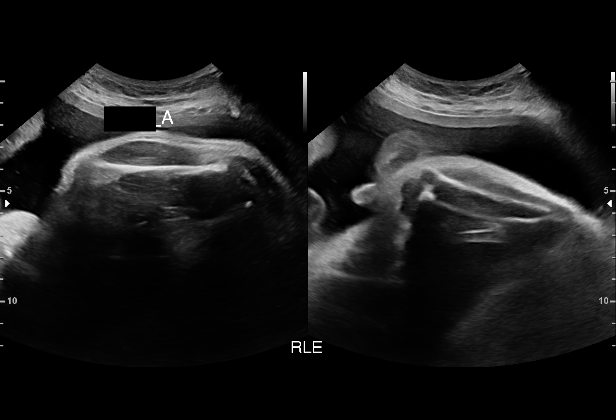
[im 39/87]
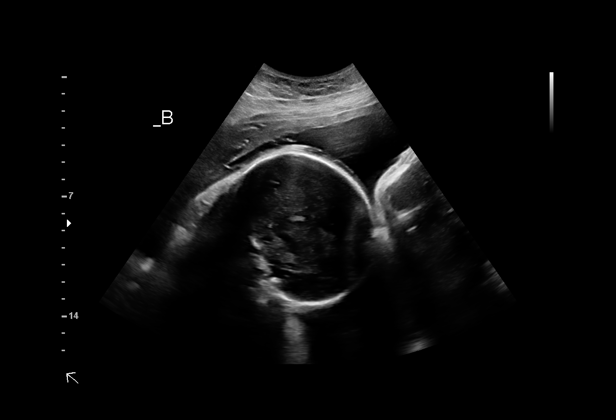
[im 45/87]
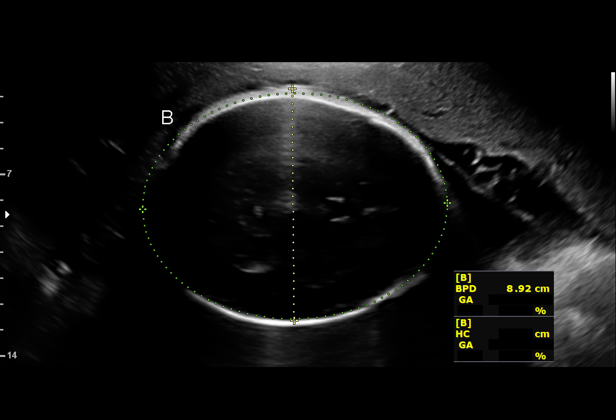
[im 48/87]
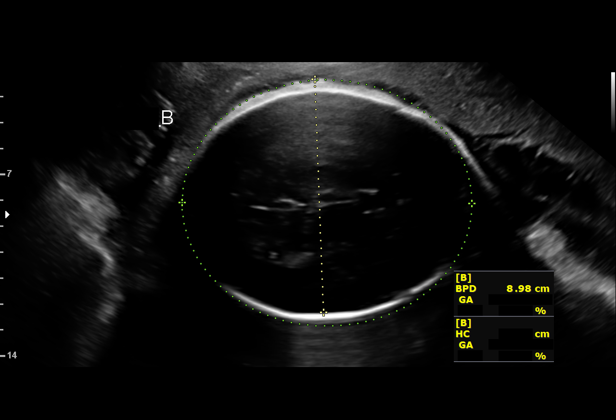
[im 55/87]
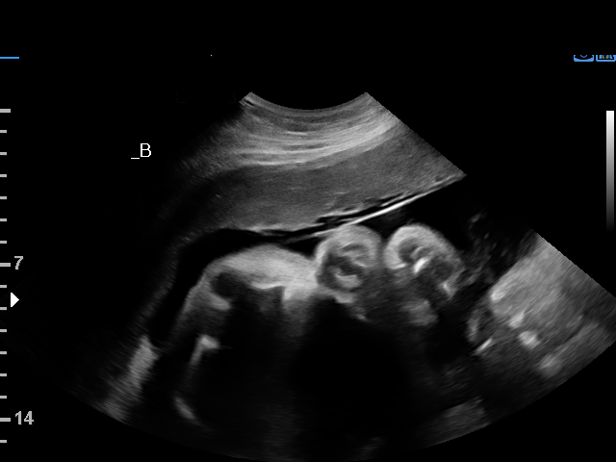
[im 61/87]
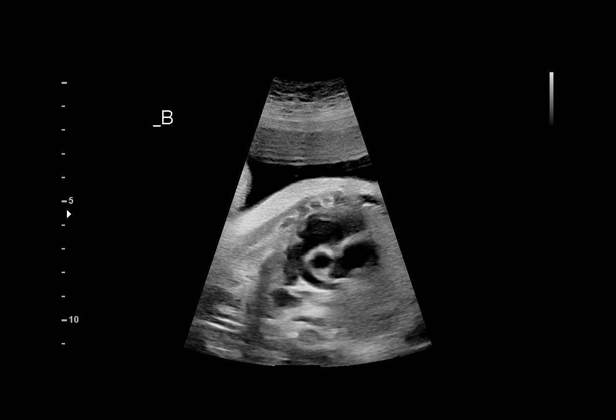
[im 67/87]
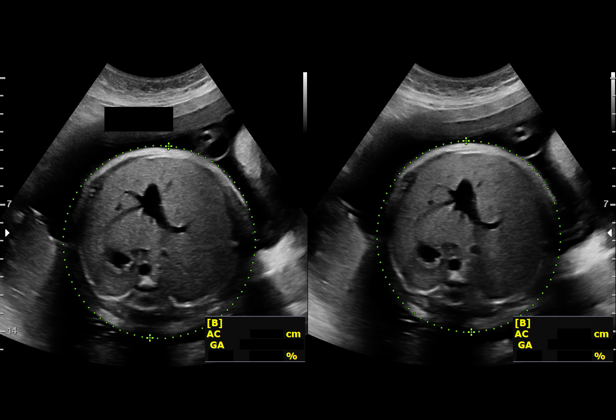
[im 74/87]
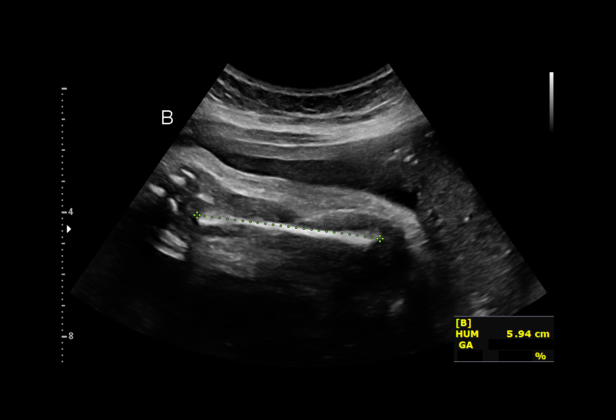
[im 80/87]
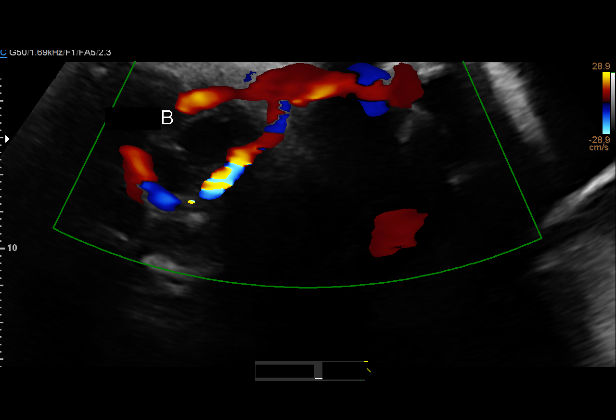
[im 87/87]
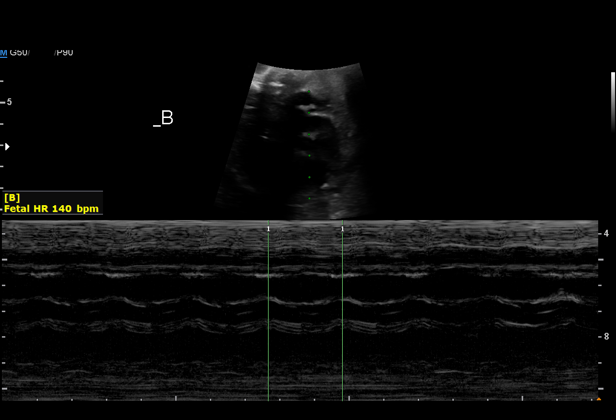

[15 of 28 positions shown; findings below may reference images not displayed]

1  US MFM OB DETAIL ADDL GEST           76811.02     ESPERANZA ELIAS
     +14 WK
     W/NONSTRESS ADD'L GEST
 ----------------------------------------------------------------------

 ----------------------------------------------------------------------
Indications

  Encounter for antenatal screening for
  malformations
  Twin pregnancy, di/di, third trimester
  Pre-eclampsia (third trimester)
  34 weeks gestation of pregnancy
 ----------------------------------------------------------------------
Fetal Evaluation (Fetus A)

 Num Of Fetuses:         2
 Fetal Heart Rate(bpm):  136
 Cardiac Activity:       Observed
 Fetal Lie:              Maternal left side
 Presentation:           Cephalic
 Placenta:               Anterior
 P. Cord Insertion:      Visualized
 Membrane Desc:      Dividing Membrane seen

 Amniotic Fluid
 AFI FV:      Within normal limits

                             Largest Pocket(cm)

Biophysical Evaluation (Fetus A)

 Amniotic F.V:   Within normal limits       F. Tone:        Observed
 F. Movement:    Observed                   Score:          [DATE]
 F. Breathing:   Observed
Biometry (Fetus A)

 BPD:      91.9  mm     G. Age:  37w 2d         97  %    CI:        74.67   %    70 - 86
                                                         FL/HC:      18.9   %    20.1 -
 HC:      337.5  mm     G. Age:  38w 5d         95  %    HC/AC:      1.07        0.93 -
 AC:      316.7  mm     G. Age:  35w 4d         77  %    FL/BPD:     69.3   %    71 - 87
 FL:       63.7  mm     G. Age:  32w 6d          6  %    FL/AC:      20.1   %    20 - 24
 HUM:      59.2  mm     G. Age:  34w 2d         54  %

 Est. FW:    7332  gm    5 lb 14 oz      62  %     FW Discordancy         4  %
OB History

 Gravidity:    1         Term:   0
 Living:       0
Gestational Age (Fetus A)

 LMP:           34w 6d        Date:  02/10/19                 EDD:   11/17/19
 U/S Today:     36w 1d                                        EDD:   11/08/19
 Best:          34w 6d     Det. By:  LMP  (02/10/19)          EDD:   11/17/19
Anatomy (Fetus A)

 Cranium:               Appears normal         Aortic Arch:            Not well visualized
 Cavum:                 Appears normal         Ductal Arch:            Not well visualized
 Ventricles:            Appears normal         Diaphragm:              Appears normal
 Choroid Plexus:        Not well visualized    Stomach:                Appears normal, left
                                                                       sided
 Cerebellum:            Not well visualized    Abdomen:                Appears normal
 Posterior Fossa:       Not well visualized    Abdominal Wall:         Not well visualized
 Nuchal Fold:           Not applicable (>20    Cord Vessels:           Not well visualized
                        wks GA)
 Face:                  Orbits appear          Kidneys:                Appear normal
                        normal
 Lips:                  Appears normal         Bladder:                Appears normal
 Thoracic:              Appears normal         Spine:                  Not well visualized
 Heart:                 Not well visualized    Upper Extremities:      Not well visualized
 RVOT:                  Not well visualized    Lower Extremities:      Not well visualized
 LVOT:                  Not well visualized

 Other:  Female gender Technically difficult due to maternal habitus and fetal
         position. Technically difficult due to advanced gestational age.

Fetal Evaluation (Fetus B)

 Num Of Fetuses:         2
 Fetal Heart Rate(bpm):  140
 Cardiac Activity:       Observed
 Fetal Lie:              Maternal right side
 Presentation:           Cephalic
 Placenta:               Anterior Fundal
 P. Cord Insertion:      Not well visualized
 Membrane Desc:      Dividing Membrane seen
 Amniotic Fluid
 AFI FV:      Within normal limits

                             Largest Pocket(cm)

Biophysical Evaluation (Fetus B)

 Amniotic F.V:   Within normal limits       F. Tone:        Observed
 F. Movement:    Observed                   Score:          [DATE]
 F. Breathing:   Observed
Biometry (Fetus B)

 BPD:      89.5  mm     G. Age:  36w 2d         86  %    CI:        76.33   %    70 - 86
                                                         FL/HC:      20.4   %    20.1 -
 HC:      324.6  mm     G. Age:  36w 5d         64  %    HC/AC:      1.00        0.93 -
 AC:      325.5  mm     G. Age:  36w 3d         92  %    FL/BPD:     73.9   %    71 - 87
 FL:       66.1  mm     G. Age:  34w 0d         22  %    FL/AC:      20.3   %    20 - 24
 HUM:      58.5  mm     G. Age:  33w 6d         46  %
 LV:        1.7  mm

 Est. FW:    8808  gm      6 lb 2 oz     75  %     FW Discordancy      0 \ 4 %
Gestational Age (Fetus B)

 LMP:           34w 6d        Date:  02/10/19                 EDD:   11/17/19
 U/S Today:     35w 6d                                        EDD:   11/10/19
 Best:          34w 6d     Det. By:  LMP  (02/10/19)          EDD:   11/17/19
Anatomy (Fetus B)

 Cranium:               Appears normal         Aortic Arch:            Not well visualized
 Cavum:                 Appears normal         Ductal Arch:            Appears normal
 Ventricles:            Appears normal         Diaphragm:              Appears normal
 Choroid Plexus:        Not well visualized    Stomach:                Appears normal, left
                                                                       sided
 Cerebellum:            Not well visualized    Abdomen:                Appears normal
 Posterior Fossa:       Not well visualized    Abdominal Wall:         Appears nml (cord
                                                                       insert, abd wall)
 Nuchal Fold:           Not applicable (>20    Cord Vessels:           Appears normal (3
                        wks GA)                                        vessel cord)
 Face:                  Appears normal         Kidneys:                Not well visualized
                        (orbits and profile)
 Lips:                  Not well visualized    Bladder:                Appears normal
 Thoracic:              Appears normal         Spine:                  Not well visualized
 Heart:                 Appears normal         Upper Extremities:      Visualized
                        (4CH, axis, and
                        situs)
 RVOT:                  Appears normal         Lower Extremities:      Not well visualized
 LVOT:                  Appears normal

 Other:  Male gender. Technically difficult due to maternal habitus and fetal
         position. Technically difficult due to advanced gestational age.
Impression

 Patient with dichorionic-diamniotic twin pregnancy is admitted
 with diagnosis of preeclampsia without severe features.

 Twin A: Maternal left, cephalic presentation, anterior
 placenta.  Amniotic fluid is normal good fetal activity seen.
 Fetal growth is appropriate for gestational age.  Fetal
 anatomical survey appears normal, but limited by advanced
 gestational age.Antenatal testing is reassuring. BPP [DATE].

 Twin B: Maternal right, cephalic presentation, anterior
 placenta. Amniotic fluid is normal good fetal activity seen.
 Fetal growth is appropriate for gestational age.  Fetal
 anatomical survey appears normal, but limited by advanced
 gestational age.Antenatal testing is reassuring. BPP [DATE].
 Growth discordancy: 4% (normal).
Recommendations

 -BPP Next week.
 -Delivery at 36 weeks provided no severe features are
 present.
                 Deltoro, Sina
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
## 2020-03-25 ENCOUNTER — Telehealth (HOSPITAL_COMMUNITY): Payer: PRIVATE HEALTH INSURANCE | Admitting: Psychiatry
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
## 2020-04-09 ENCOUNTER — Ambulatory Visit: Payer: 59

## 2020-04-10 ENCOUNTER — Other Ambulatory Visit: Payer: Self-pay

## 2020-04-10 ENCOUNTER — Encounter (HOSPITAL_COMMUNITY): Payer: Self-pay | Admitting: Psychiatry

## 2020-04-10 ENCOUNTER — Telehealth (INDEPENDENT_AMBULATORY_CARE_PROVIDER_SITE_OTHER): Payer: PRIVATE HEALTH INSURANCE | Admitting: Psychiatry

## 2020-04-10 DIAGNOSIS — F411 Generalized anxiety disorder: Secondary | ICD-10-CM

## 2020-04-10 MED ORDER — DULOXETINE HCL 60 MG PO CPEP: 60.0000 mg | ORAL_CAPSULE | Freq: Every day | ORAL | 0 refills | Status: AC

## 2020-04-10 NOTE — Patient Instructions (Addendum)
1.Increaseduloxetine 60 mg daily  2.Next appointment: 9/8 at 8:50 3. Referral to therapy

## 2020-04-23 ENCOUNTER — Telehealth (HOSPITAL_COMMUNITY): Payer: Self-pay | Admitting: Psychiatry

## 2020-04-23 NOTE — Telephone Encounter (Signed)
Called patient to schedule therapy appt, LVM
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
## 2020-05-22 ENCOUNTER — Other Ambulatory Visit: Payer: Self-pay

## 2020-05-22 ENCOUNTER — Telehealth (HOSPITAL_COMMUNITY): Payer: Self-pay | Admitting: Psychiatry

## 2020-05-22 ENCOUNTER — Telehealth (HOSPITAL_COMMUNITY): Payer: PRIVATE HEALTH INSURANCE | Admitting: Psychiatry

## 2020-05-22 NOTE — Telephone Encounter (Signed)
Sent link for video visit through Epic. Patient did not sign in. Called the patient  for appointment scheduled today. The patient did not answer the phone. Left voice message to contact the office.  

## 2020-07-15 ENCOUNTER — Encounter: Payer: Self-pay | Admitting: Advanced Practice Midwife

## 2020-07-15 ENCOUNTER — Other Ambulatory Visit: Payer: Self-pay

## 2020-07-15 ENCOUNTER — Ambulatory Visit (INDEPENDENT_AMBULATORY_CARE_PROVIDER_SITE_OTHER): Payer: 59 | Admitting: Advanced Practice Midwife

## 2020-07-15 VITALS — BP 119/83 | HR 58 | Ht 66.5 in | Wt 231.0 lb

## 2020-07-15 DIAGNOSIS — N946 Dysmenorrhea, unspecified: Secondary | ICD-10-CM | POA: Diagnosis not present

## 2020-07-15 DIAGNOSIS — N920 Excessive and frequent menstruation with regular cycle: Secondary | ICD-10-CM

## 2020-07-15 DIAGNOSIS — Z30011 Encounter for initial prescription of contraceptive pills: Secondary | ICD-10-CM

## 2020-07-15 MED ORDER — NORETHIN-ETH ESTRAD-FE BIPHAS 1 MG-10 MCG / 10 MCG PO TABS: 1.0000 | ORAL_TABLET | Freq: Every day | ORAL | 4 refills | Status: DC

## 2020-07-15 NOTE — Patient Instructions (Signed)

## 2020-07-15 NOTE — Progress Notes (Signed)
[facility] Visit  Patient name: Rachel Roy MRN [id number]  Date of birth: 09/28/1985  CC & HPI:  Rachel Roy is a 34 y.o.  female presenting today for heavy periods.  Had twins in January, got depo after delivery.  And again 3 months later. Has had 2 periods since stopping, periods have been heavy w/clots.  Also felt like she had terrible PMS (diarrhea, felt like she was sick, weak) for a few days prior to starting period (a little of those sx w/first period, more w/this last one).  Wants to go back on COCs  Intercourse can be painful (feels dry in vagina) since giving birth.   Pertinent History Reviewed:  Medical & Surgical Hx:   Past Medical History:  Diagnosis Date  . AKI (acute kidney injury) (HCC) 10/16/2019  . Anxiety   . GERD (gastroesophageal reflux disease)   . Heart murmur   . Preeclampsia, third trimester 10/11/2019   Past Surgical History:  Procedure Laterality Date  . BREAST SURGERY  2010   reduction  . CESAREAN SECTION N/A 10/15/2019   Procedure: CESAREAN SECTION;  Surgeon: Tereso Newcomer, MD;  Location: [facility];  Service: Obstetrics;  Laterality: N/A;  . PERINEAL LACERATION REPAIR N/A 10/15/2019   Procedure: SUTURE REPAIR PERINEAL LACERATION;  Surgeon: Tereso Newcomer, MD;  Location: [facility];  Service: Obstetrics;  Laterality: N/A;   Family History  Problem Relation Age of Onset  . Asthma Mother        childhood  . Cancer Mother        skin cancer  . Anxiety disorder Mother   . Hyperlipidemia Father   . Arthritis Maternal Grandmother   . Glaucoma Maternal Grandmother   . Heart disease Maternal Grandmother   . Heart disease Maternal Grandfather        died cancer, heart at 68s  . Hyperlipidemia Maternal Grandfather   . Hypertension Maternal Grandfather   . Stroke Maternal Grandfather   . Cancer Maternal Grandfather        skin  . Cancer Paternal Grandmother        pancreatic  . Cancer Paternal Grandfather 2  . Colon cancer Neg  Hx     Current Outpatient Medications:  .  DULoxetine (CYMBALTA) 60 MG capsule, Take 1 capsule (60 mg total) by mouth daily., Disp: 90 capsule, Rfl: 0 Social History: Reviewed -  reports that she has never smoked. She has never used smokeless tobacco.  Review of Systems:   Constitutional: Negative for fever and chills Eyes: Negative for visual disturbances Respiratory: Negative for shortness of breath, dyspnea Cardiovascular: Negative for chest pain or palpitations  Gastrointestinal: Negative for vomiting, diarrhea and constipation; no abdominal pain Genitourinary: Negative for dysuria and urgency, vaginal irritation or itching Musculoskeletal: Negative for back pain, joint pain, myalgias  Neurological: Negative for dizziness and headaches    Objective Findings:    Physical Examination: Vitals:   07/15/20 1455  BP: 119/83  Pulse: (!) 58   General appearance - well appearing, and in no distress Mental status - alert, oriented to person, place, and time Chest:  Normal respiratory effort Heart - normal rate and regular rhythm Abdomen:  Soft, nontender Pelvic: deferred Musculoskeletal:  Normal range of motion without pain Extremities:  No edema    No results found for this or any previous visit (from the past 24 hour(s)).    Assessment & Plan:  A:   Contraception mgt  Dysmenorrhea  menorrhagia P:  Start LoLoestrin   Vaginal Moisturizer    No follow-ups on file.  Jacklyn Shell CNM 07/15/2020 3:06 PM

## 2021-03-01 ENCOUNTER — Encounter (HOSPITAL_COMMUNITY): Payer: Self-pay

## 2021-03-01 ENCOUNTER — Other Ambulatory Visit: Payer: Self-pay

## 2021-03-01 ENCOUNTER — Inpatient Hospital Stay (HOSPITAL_COMMUNITY)
Admission: AD | Admit: 2021-03-01 | Discharge: 2021-03-01 | Disposition: A | Discharge status: Home / Self Care | Payer: 59 | Attending: Obstetrics and Gynecology | Admitting: Obstetrics and Gynecology

## 2021-03-01 ENCOUNTER — Inpatient Hospital Stay (HOSPITAL_COMMUNITY): Payer: 59

## 2021-03-01 DIAGNOSIS — O26891 Other specified pregnancy related conditions, first trimester: Secondary | ICD-10-CM | POA: Insufficient documentation

## 2021-03-01 DIAGNOSIS — Z3A01 Less than 8 weeks gestation of pregnancy: Secondary | ICD-10-CM | POA: Insufficient documentation

## 2021-03-01 DIAGNOSIS — O209 Hemorrhage in early pregnancy, unspecified: Secondary | ICD-10-CM | POA: Diagnosis present

## 2021-03-01 DIAGNOSIS — O034 Incomplete spontaneous abortion without complication: Secondary | ICD-10-CM | POA: Diagnosis not present

## 2021-03-01 DIAGNOSIS — Z6730 Type AB blood, Rh positive: Secondary | ICD-10-CM

## 2021-03-01 DIAGNOSIS — Z79899 Other long term (current) drug therapy: Secondary | ICD-10-CM | POA: Insufficient documentation

## 2021-03-01 DIAGNOSIS — O4690 Antepartum hemorrhage, unspecified, unspecified trimester: Secondary | ICD-10-CM

## 2021-03-01 DIAGNOSIS — R109 Unspecified abdominal pain: Secondary | ICD-10-CM | POA: Diagnosis not present

## 2021-03-01 LAB — CBC
HCT: 34.4 % — ABNORMAL LOW (ref 36.0–46.0)
Hemoglobin: 10.9 g/dL — ABNORMAL LOW (ref 12.0–15.0)
MCH: 26 pg (ref 26.0–34.0)
MCHC: 31.7 g/dL (ref 30.0–36.0)
MCV: 82.1 fL (ref 80.0–100.0)
Platelets: 344 10*3/uL (ref 150–400)
RBC: 4.19 MIL/uL (ref 3.87–5.11)
RDW: 14.4 % (ref 11.5–15.5)
WBC: 10.6 10*3/uL — ABNORMAL HIGH (ref 4.0–10.5)
nRBC: 0 % (ref 0.0–0.2)

## 2021-03-01 LAB — POCT PREGNANCY, URINE: Preg Test, Ur: POSITIVE — AB

## 2021-03-01 LAB — HCG, QUANTITATIVE, PREGNANCY: hCG, Beta Chain, Quant, S: 11266 m[IU]/mL — ABNORMAL HIGH (ref <5)

## 2021-03-01 MED ORDER — MISOPROSTOL 200 MCG PO TABS
600.0000 ug | ORAL_TABLET | Freq: Once | ORAL | Status: AC
  Administered 2021-03-01: 600 ug via BUCCAL
  Filled 2021-03-01: qty 3

## 2021-03-01 MED ORDER — OXYCODONE-ACETAMINOPHEN 5-325 MG PO TABS: 1.0000 | ORAL_TABLET | ORAL | 0 refills | Status: AC | PRN

## 2021-03-01 MED ORDER — KETOROLAC TROMETHAMINE 60 MG/2ML IM SOLN
60.0000 mg | Freq: Once | INTRAMUSCULAR | Status: AC
  Administered 2021-03-01: 60 mg via INTRAMUSCULAR
  Filled 2021-03-01: qty 2

## 2021-03-01 NOTE — MAU Note (Signed)
Pt. Reports to mau via ems with c/o vag bleeding that started around 1630 today.  Pt reports some lower abd cramping as well but states tylenol has helped relieve it.  Reports passing softball size clots.

## 2021-03-01 NOTE — MAU Provider Note (Addendum)
History     CSN: [id number]  Arrival date and time: 03/01/21 1821   Event Date/Time   First Provider Initiated Contact with Patient 03/01/21 1849      Chief Complaint  Patient presents with   Abdominal Pain   Vaginal Bleeding   HPI OB History     Gravida  2   Para  1   Term  0   Preterm  1   AB  0   Living  2      SAB  0   IAB  0   Ectopic  0   Multiple  1   Live Births  2           Past Medical History:  Diagnosis Date   AKI (acute kidney injury) (HCC) 10/16/2019   Anxiety    GERD (gastroesophageal reflux disease)    Heart murmur    Preeclampsia, third trimester 10/11/2019    Past Surgical History:  Procedure Laterality Date   BREAST SURGERY  2010   reduction   CESAREAN SECTION N/A 10/15/2019   Procedure: CESAREAN SECTION;  Surgeon: Tereso Newcomer, MD;  Location: [facility];  Service: Obstetrics;  Laterality: N/A;   PERINEAL LACERATION REPAIR N/A 10/15/2019   Procedure: SUTURE REPAIR PERINEAL LACERATION;  Surgeon: Tereso Newcomer, MD;  Location: [facility];  Service: Obstetrics;  Laterality: N/A;    Family History  Problem Relation Age of Onset   Asthma Mother        childhood   Cancer Mother        skin cancer   Anxiety disorder Mother    Hyperlipidemia Father    Arthritis Maternal Grandmother    Glaucoma Maternal Grandmother    Heart disease Maternal Grandmother    Heart disease Maternal Grandfather        died cancer, heart at 24s   Hyperlipidemia Maternal Grandfather    Hypertension Maternal Grandfather    Stroke Maternal Grandfather    Cancer Maternal Grandfather        skin   Cancer Paternal Grandmother        pancreatic   Cancer Paternal Grandfather 59   Colon cancer Neg Hx     Social History   Tobacco Use   Smoking status: Never   Smokeless tobacco: Never  Vaping Use   Vaping Use: Never used  Substance Use Topics   Alcohol use: Not Currently    Comment: occasionally   Drug use: No    Allergies: No Known  Allergies  Medications Prior to Admission  Medication Sig Dispense Refill Last Dose   DULoxetine (CYMBALTA) 60 MG capsule Take 1 capsule (60 mg total) by mouth daily. 90 capsule 0    Norethindrone-Ethinyl Estradiol-Fe Biphas (LO LOESTRIN FE) 1 MG-10 MCG / 10 MCG tablet Take 1 tablet by mouth daily. 90 tablet 4     Review of Systems  Gastrointestinal:  Positive for abdominal pain.  Genitourinary:  Positive for vaginal bleeding.  Musculoskeletal:  Positive for back pain.  Physical Exam   Blood pressure (!) 130/97, pulse 82, last menstrual period 12/16/2020, SpO2 96 %, not currently breastfeeding.  Physical Exam Vitals and nursing note reviewed. Exam conducted with a chaperone present.  Constitutional:      General: She is not in acute distress.    Appearance: Normal appearance.  HENT:     Head: Normocephalic.  Cardiovascular:     Rate and Rhythm: Normal rate.  Pulmonary:     Effort: Pulmonary  effort is normal. No respiratory distress.  Abdominal:     General: There is no distension.     Palpations: Abdomen is soft. There is no mass.     Tenderness: There is no abdominal tenderness. There is no guarding or rebound.     Hernia: No hernia is present.  Genitourinary:    Comments: External: no lesions or erythema Vagina: rugated, pink, moist, large amount of clots, moderate bleeding; cleared with 2 4x4 gauze. Cervix FT   Musculoskeletal:        General: Normal range of motion.     Cervical back: Normal range of motion.  Skin:    General: Skin is warm and dry.  Neurological:     General: No focal deficit present.     Mental Status: She is alert and oriented to person, place, and time.  Psychiatric:        Mood and Affect: Mood normal.        Behavior: Behavior normal.   Results for orders placed or performed during the hospital encounter of 03/01/21 (from the past 24 hour(s))  Pregnancy, urine POC     Status: Abnormal   Collection Time: 03/01/21  6:31 PM  Result Value Ref  Range   Preg Test, Ur POSITIVE (A) NEGATIVE  CBC     Status: Abnormal   Collection Time: 03/01/21  7:22 PM  Result Value Ref Range   WBC 10.6 (H) 4.0 - 10.5 K/uL   RBC 4.19 3.87 - 5.11 MIL/uL   Hemoglobin 10.9 (L) 12.0 - 15.0 g/dL   HCT 69.7 (L) 94.8 - 01.6 %   MCV 82.1 80.0 - 100.0 fL   MCH 26.0 26.0 - 34.0 pg   MCHC 31.7 30.0 - 36.0 g/dL   RDW 55.3 74.8 - 27.0 %   Platelets 344 150 - 400 K/uL   nRBC 0.0 0.0 - 0.2 %  hCG, quantitative, pregnancy     Status: Abnormal   Collection Time: 03/01/21  7:22 PM  Result Value Ref Range   hCG, Beta Chain, Quant, S 11,266 (H) <5 mIU/mL   US OB LESS THAN 14 WEEKS WITH OB TRANSVAGINAL  Result Date: 03/01/2021 CLINICAL DATA:  Positive urinary pregnancy test.  Vaginal bleeding EXAM: OBSTETRIC <14 WK [place] AND TRANSVAGINAL OB US TECHNIQUE: Both transabdominal and transvaginal ultrasound examinations were performed for complete evaluation of the gestation as well as the maternal uterus, adnexal regions, and pelvic cul-de-sac. Transvaginal technique was performed to assess early pregnancy. COMPARISON:  None. FINDINGS: Intrauterine gestational sac: None Yolk sac:  Not Visualized. Embryo:  Not Visualized. Cardiac Activity: Not Visualized. Maternal uterus/adnexae: Anteverted uterus. Endometrial thickness of 2.1 cm with mildly prominent vascularity within the endometrial complex on color Doppler. Small amount of fluid within the lower uterine segment/endocervical canal. No intrauterine gestational sac identified. Bilateral ovaries appear within normal limits. No adnexal masses are seen. No free fluid within the pelvis. IMPRESSION: 1. No evidence of intrauterine pregnancy. No adnexal mass identified. Ectopic pregnancy cannot be entirely excluded. Recommend close clinical follow-up, short interval repeat exam, and serial beta HCG levels. 2. Thickened, heterogeneous endometrium with increased internal vascularity. Findings are nonspecific but may be related to retained  products of conception. Electronically Signed   By: Duanne Guess D.O.   On: 03/01/2021 20:10     MAU Course  Procedures  MDM Review of chart from [facility]: [place] on 6/13 showed IUGS only measuring [redacted]w[redacted]d. Spoke with Dr. Senaida Ores, [place] yesterday showed IUGS only measuring [redacted]w[redacted]d and qhcg  on 6/13 was 9875.  Labs and [place] ordered. Transfer of care given to Joel Mericle, FNP. Donette Larry, CNM  03/01/2021 8:17 PM   Resumed care at 8:17 pm Reviewed [place] results with patient and partner.  Cytotec offered given ? Retained products on [place]. Patient and partner agreeable.  Cytotec 600 mcg given buccal, toradol 60 mg IM given Patient waited 1 hour after cytotec, she was able to get up to the bathroom without difficulty, pain is 0/10, and bleeding is reported as decreased.  Assessment and Plan   A:  1. Incomplete miscarriage   2. Vaginal bleeding in pregnancy   3. [redacted] weeks gestation of pregnancy   4. Type AB blood, Rh positive      P:  Discharge home with strict return precautions Pelvic rest Bleeding precautions Will call Dr. Senaida Ores Sunday morning to arrange f/u in the office Ok to start iron every other day PO Rx: Percocet #6 no refill Ok to use ibuprofen OTC as directed on the bottle   Venia Carbon I, NP 03/02/2021 12:19 AM

## 2021-03-04 LAB — SURGICAL PATHOLOGY

## 2021-06-20 IMAGING — US US OB < 14 WEEKS - US OB TV
1 series · 15 of 28 positions shown · non-contrast
Comparison: None.

CLINICAL DATA: Positive urinary pregnancy test.  Vaginal bleeding

EXAM:
OBSTETRIC <14 WK US AND TRANSVAGINAL OB US
TECHNIQUE: Both transabdominal and transvaginal ultrasound examinations were
performed for complete evaluation of the gestation as well as the
maternal uterus, adnexal regions, and pelvic cul-de-sac.
Transvaginal technique was performed to assess early pregnancy.

[Series 1: us ob < 14 weeks - us ob tv · 39 acquisitions, 15 frames shown]
[im 1/39]
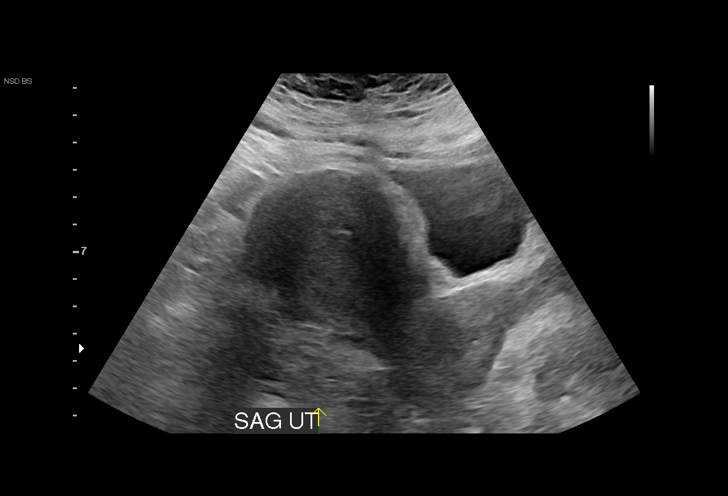
[im 3/39]
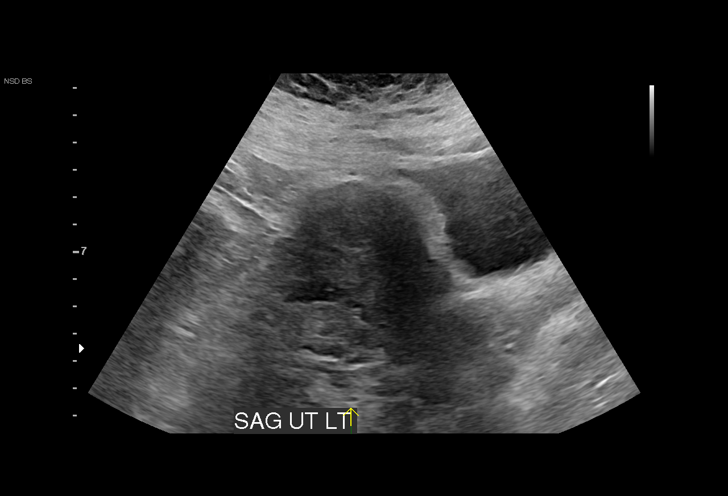
[im 6/39]
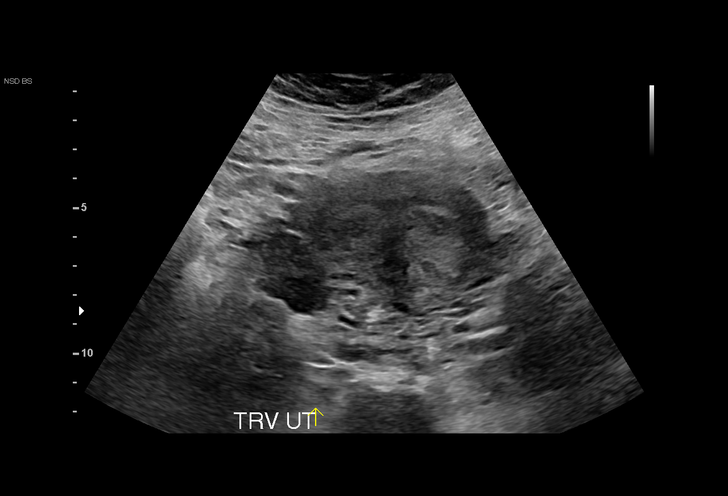
[im 9/39]
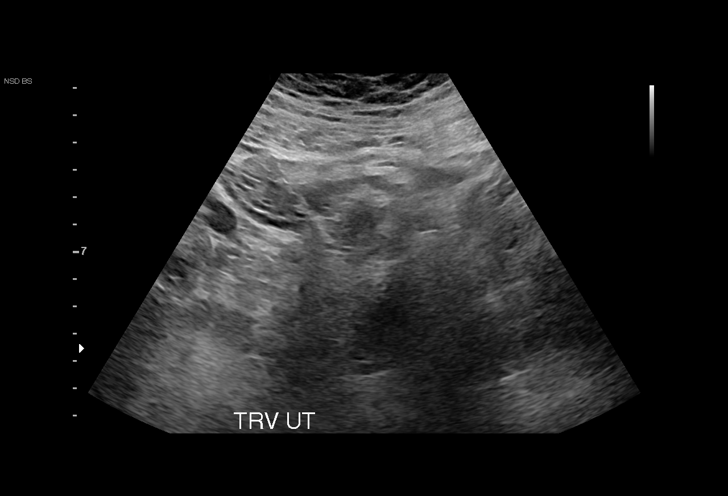
[im 12/39]
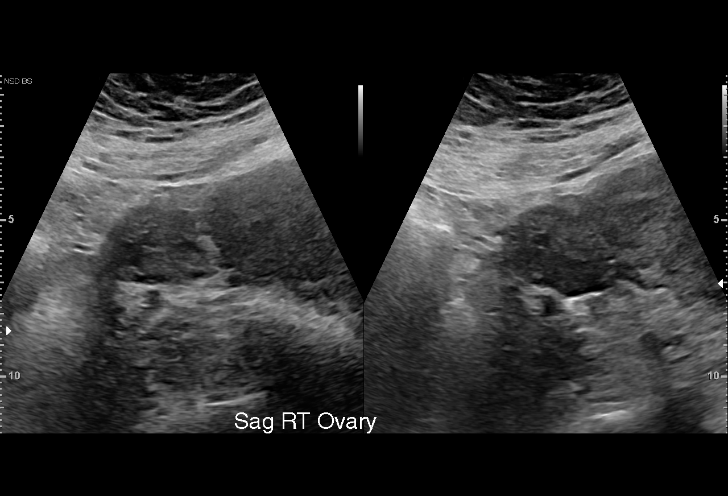
[im 15/39]
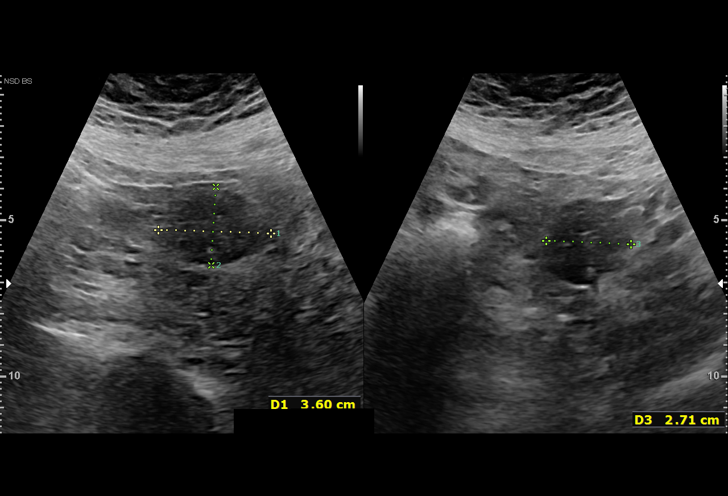
[im 17/39]
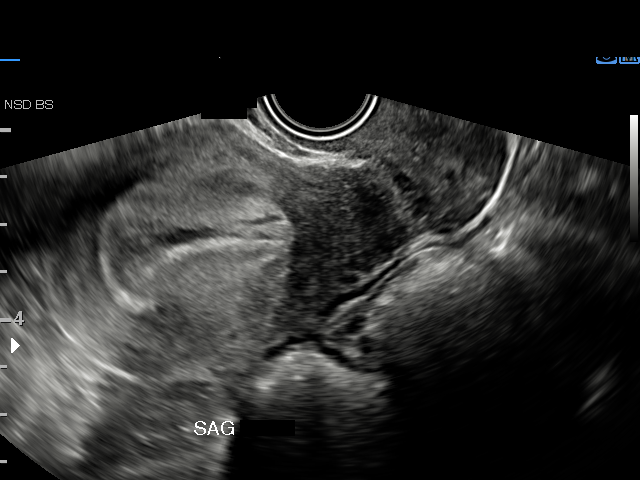
[im 20/39]
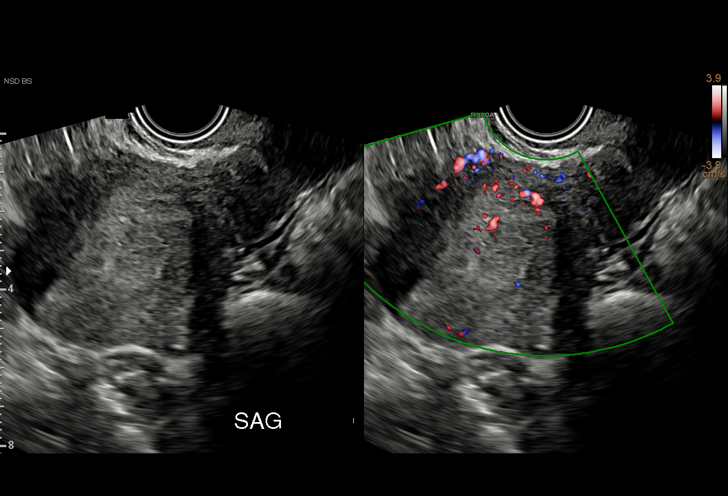
[im 22/39]
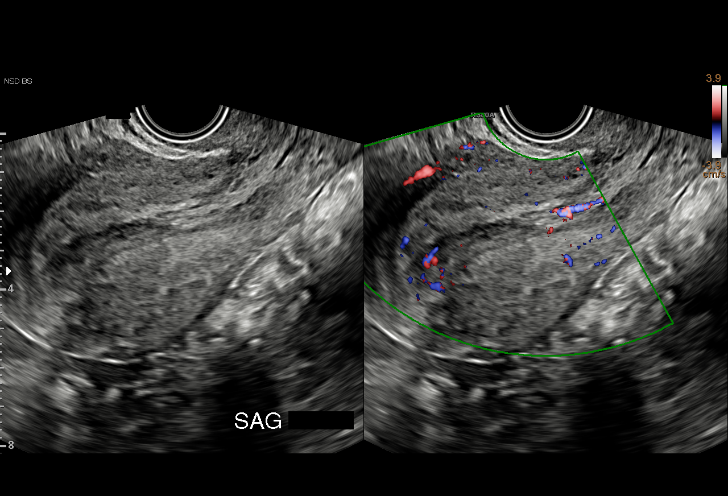
[im 24/39]
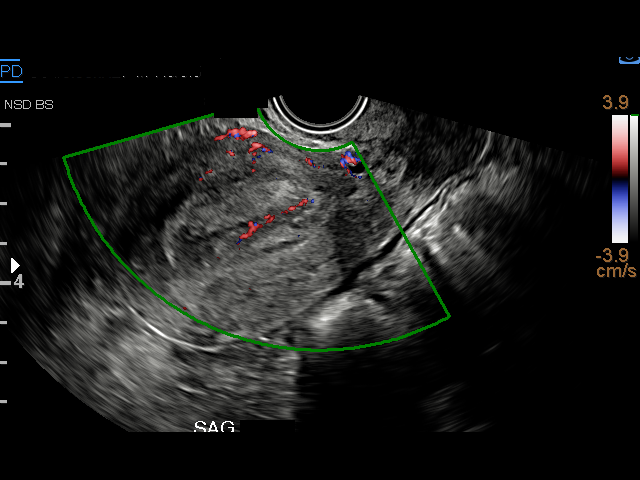
[im 27/39]
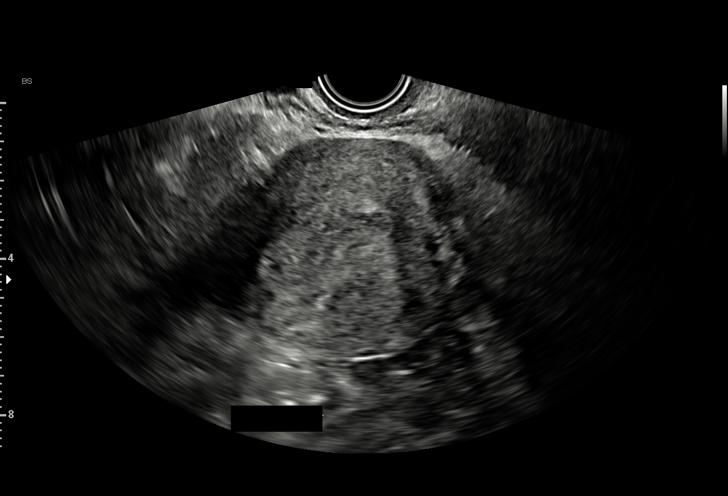
[im 30/39]
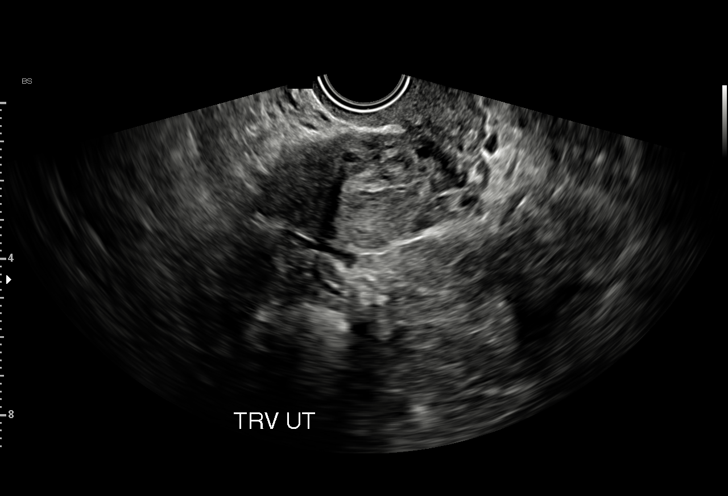
[im 33/39]
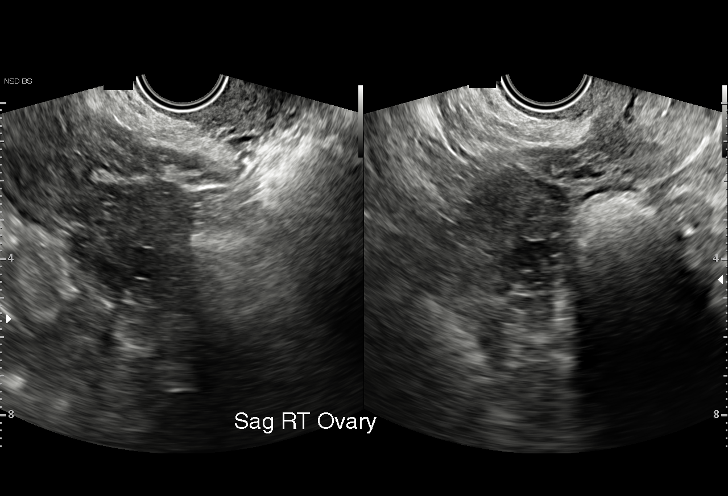
[im 36/39]
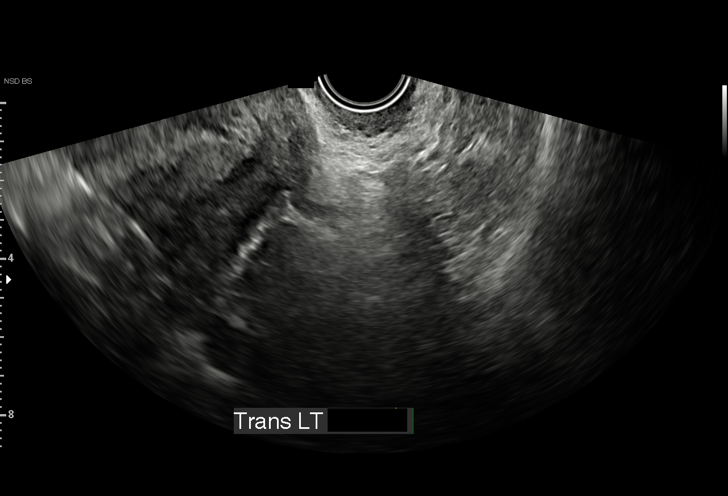
[im 39/39]
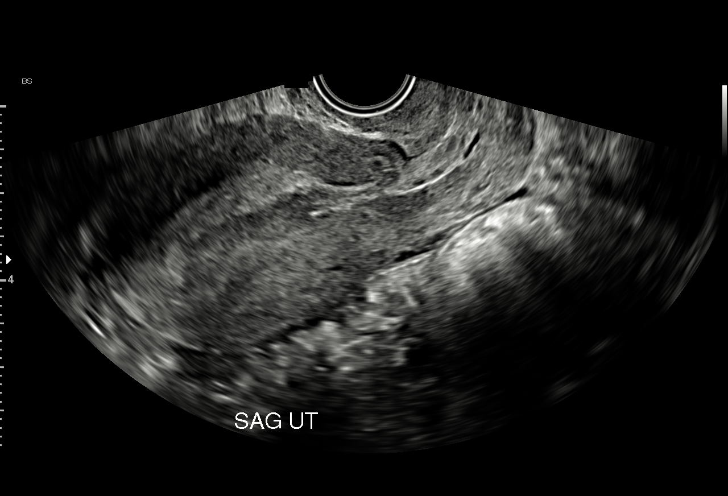

[15 of 28 positions shown; findings below may reference images not displayed]

FINDINGS: Intrauterine gestational sac: None

Yolk sac:  Not Visualized.

Embryo:  Not Visualized.

Cardiac Activity: Not Visualized.

Maternal uterus/adnexae: Anteverted uterus. Endometrial thickness of
2.1 cm with mildly prominent vascularity within the endometrial
complex on color Doppler. Small amount of fluid within the lower
uterine segment/endocervical canal. No intrauterine gestational sac
identified. Bilateral ovaries appear within normal limits. No
adnexal masses are seen. No free fluid within the pelvis.
IMPRESSION: 1. No evidence of intrauterine pregnancy. No adnexal mass
identified. Ectopic pregnancy cannot be entirely excluded. Recommend
close clinical follow-up, short interval repeat exam, and serial
beta HCG levels.
2. Thickened, heterogeneous endometrium with increased internal
vascularity. Findings are nonspecific but may be related to retained
products of conception.

## 2022-02-23 LAB — OB RESULTS CONSOLE ANTIBODY SCREEN: Antibody Screen: NEGATIVE

## 2022-02-23 LAB — OB RESULTS CONSOLE HIV ANTIBODY (ROUTINE TESTING): HIV: NONREACTIVE

## 2022-02-23 LAB — OB RESULTS CONSOLE HEPATITIS B SURFACE ANTIGEN: Hepatitis B Surface Ag: NEGATIVE

## 2022-02-23 LAB — OB RESULTS CONSOLE RPR: RPR: NONREACTIVE

## 2022-02-23 LAB — OB RESULTS CONSOLE GC/CHLAMYDIA
Chlamydia: NEGATIVE
Neisseria Gonorrhea: NEGATIVE

## 2022-02-23 LAB — OB RESULTS CONSOLE RUBELLA ANTIBODY, IGM: Rubella: IMMUNE

## 2022-02-23 LAB — HEPATITIS C ANTIBODY: HCV Ab: NEGATIVE

## 2022-02-23 LAB — OB RESULTS CONSOLE ABO/RH: RH Type: POSITIVE

## 2022-08-13 LAB — OB RESULTS CONSOLE GBS: GBS: NEGATIVE

## 2022-08-19 ENCOUNTER — Telehealth (HOSPITAL_COMMUNITY): Payer: Self-pay | Admitting: *Deleted

## 2022-08-19 ENCOUNTER — Encounter (HOSPITAL_COMMUNITY): Payer: Self-pay

## 2022-08-19 ENCOUNTER — Encounter (HOSPITAL_COMMUNITY): Payer: Self-pay | Admitting: *Deleted

## 2022-08-19 NOTE — Telephone Encounter (Signed)
Preadmission screen  

## 2022-08-19 NOTE — Patient Instructions (Signed)
Rachel Roy  08/19/2022   Your procedure is scheduled on:  09/02/2022  Arrive at 0630 at [employer] [facility] on [address] at [facility]  and [employer]. You are invited to use the FREE valet parking or use the Visitor's parking deck.  Pick up the phone at the desk and dial [phone].  Call this number if you have problems the morning of surgery: [phone]  Remember:   Do not eat food:(After Midnight) Desps de medianoche.  Do not drink clear liquids: (After Midnight) Desps de medianoche.  Take these medicines the morning of surgery with A SIP OF WATER:  Take cymbalta as prescribed   Do not wear jewelry, make-up or nail polish.  Do not wear lotions, powders, or perfumes. Do not wear deodorant.  Do not shave 48 hours prior to surgery.  Do not bring valuables to the hospital.  [facility] is not   responsible for any belongings or valuables brought to the hospital.  Contacts, dentures or bridgework may not be worn into surgery.  Leave suitcase in the car. After surgery it may be brought to your room.  For patients admitted to the hospital, checkout time is 11:00 AM the day of              discharge.      Please read over the following fact sheets that you were given:     Preparing for Surgery

## 2022-08-20 ENCOUNTER — Telehealth (HOSPITAL_COMMUNITY): Payer: Self-pay | Admitting: *Deleted

## 2022-08-20 NOTE — Telephone Encounter (Signed)
Preadmission screen  

## 2022-08-21 ENCOUNTER — Encounter (HOSPITAL_COMMUNITY): Payer: Self-pay

## 2022-08-31 ENCOUNTER — Encounter (HOSPITAL_COMMUNITY)
Admission: RE | Admit: 2022-08-31 | Discharge: 2022-08-31 | Disposition: A | Discharge status: Home / Self Care | Payer: 59 | Source: Ambulatory Visit | Attending: Obstetrics and Gynecology | Admitting: Obstetrics and Gynecology

## 2022-08-31 DIAGNOSIS — Z98891 History of uterine scar from previous surgery: Secondary | ICD-10-CM | POA: Insufficient documentation

## 2022-08-31 DIAGNOSIS — Z01812 Encounter for preprocedural laboratory examination: Secondary | ICD-10-CM | POA: Insufficient documentation

## 2022-08-31 HISTORY — DX: Encounter for other specified aftercare: Z51.89

## 2022-08-31 HISTORY — DX: Anemia, unspecified: D64.9

## 2022-08-31 LAB — TYPE AND SCREEN
ABO/RH(D): AB POS
Antibody Screen: NEGATIVE

## 2022-08-31 LAB — CBC
HCT: 36.1 % (ref 36.0–46.0)
Hemoglobin: 12 g/dL (ref 12.0–15.0)
MCH: 26.8 pg (ref 26.0–34.0)
MCHC: 33.2 g/dL (ref 30.0–36.0)
MCV: 80.6 fL (ref 80.0–100.0)
Platelets: 245 10*3/uL (ref 150–400)
RBC: 4.48 MIL/uL (ref 3.87–5.11)
RDW: 15.2 % (ref 11.5–15.5)
WBC: 8.4 10*3/uL (ref 4.0–10.5)
nRBC: 0 % (ref 0.0–0.2)

## 2022-08-31 NOTE — Anesthesia Preprocedure Evaluation (Signed)
Anesthesia Evaluation  Patient identified by MRN, date of birth, ID band Patient awake    Reviewed: Allergy & Precautions, NPO status , Patient's Chart, lab work & pertinent test results  History of Anesthesia Complications Negative for: history of anesthetic complications  Airway Mallampati: II  TM Distance: >3 FB Neck ROM: Full    Dental no notable dental hx.    Pulmonary neg pulmonary ROS   Pulmonary exam normal        Cardiovascular hypertension, Normal cardiovascular exam     Neuro/Psych   Anxiety     negative neurological ROS     GI/Hepatic negative GI ROS, Neg liver ROS,,,  Endo/Other    Morbid obesity (BMI 41)  Renal/GU negative Renal ROS  negative genitourinary   Musculoskeletal negative musculoskeletal ROS (+)    Abdominal   Peds  Hematology negative hematology ROS (+)   Anesthesia Other Findings Day of surgery medications reviewed with patient.  Reproductive/Obstetrics (+) Pregnancy (Hx of C/S x1)                              Anesthesia Physical Anesthesia Plan  ASA: 3  Anesthesia Plan: Spinal   Post-op Pain Management:    Induction:   PONV Risk Score and Plan: 4 or greater and Treatment may vary due to age or medical condition, Ondansetron and Dexamethasone  Airway Management Planned: Natural Airway  Additional Equipment: None  Intra-op Plan:   Post-operative Plan:   Informed Consent: I have reviewed the patients History and Physical, chart, labs and discussed the procedure including the risks, benefits and alternatives for the proposed anesthesia with the patient or authorized representative who has indicated his/her understanding and acceptance.       Plan Discussed with: CRNA  Anesthesia Plan Comments:          Anesthesia Quick Evaluation

## 2022-09-01 LAB — RPR: RPR Ser Ql: NONREACTIVE

## 2022-09-02 ENCOUNTER — Inpatient Hospital Stay (HOSPITAL_COMMUNITY)
Admission: RE | Admit: 2022-09-02 | Discharge: 2022-09-04 | DRG: 784 | Disposition: A | Discharge status: Home / Self Care | Payer: 59 | Attending: Obstetrics and Gynecology | Admitting: Obstetrics and Gynecology

## 2022-09-02 ENCOUNTER — Inpatient Hospital Stay (HOSPITAL_COMMUNITY): Payer: 59 | Admitting: Anesthesiology

## 2022-09-02 ENCOUNTER — Other Ambulatory Visit: Payer: Self-pay

## 2022-09-02 ENCOUNTER — Encounter (HOSPITAL_COMMUNITY): Payer: Self-pay | Admitting: Obstetrics and Gynecology

## 2022-09-02 ENCOUNTER — Encounter (HOSPITAL_COMMUNITY)
Admission: RE | Disposition: A | Discharge status: Home / Self Care | Payer: Self-pay | Source: Home / Self Care | Attending: Obstetrics and Gynecology

## 2022-09-02 DIAGNOSIS — Z3A39 39 weeks gestation of pregnancy: Secondary | ICD-10-CM

## 2022-09-02 DIAGNOSIS — O99344 Other mental disorders complicating childbirth: Secondary | ICD-10-CM | POA: Diagnosis present

## 2022-09-02 DIAGNOSIS — O34211 Maternal care for low transverse scar from previous cesarean delivery: Secondary | ICD-10-CM

## 2022-09-02 DIAGNOSIS — O99214 Obesity complicating childbirth: Secondary | ICD-10-CM | POA: Diagnosis present

## 2022-09-02 DIAGNOSIS — F419 Anxiety disorder, unspecified: Secondary | ICD-10-CM | POA: Diagnosis present

## 2022-09-02 DIAGNOSIS — Z302 Encounter for sterilization: Secondary | ICD-10-CM

## 2022-09-02 DIAGNOSIS — Z98891 History of uterine scar from previous surgery: Principal | ICD-10-CM

## 2022-09-02 DIAGNOSIS — O164 Unspecified maternal hypertension, complicating childbirth: Secondary | ICD-10-CM | POA: Diagnosis not present

## 2022-09-02 HISTORY — PX: TUBAL LIGATION: SHX77

## 2022-09-02 LAB — COMPREHENSIVE METABOLIC PANEL
ALT: 11 U/L (ref 0–44)
AST: 18 U/L (ref 15–41)
Albumin: 2.7 g/dL — ABNORMAL LOW (ref 3.5–5.0)
Alkaline Phosphatase: 103 U/L (ref 38–126)
Anion gap: 10 (ref 5–15)
BUN: 7 mg/dL (ref 6–20)
CO2: 21 mmol/L — ABNORMAL LOW (ref 22–32)
Calcium: 8.9 mg/dL (ref 8.9–10.3)
Chloride: 107 mmol/L (ref 98–111)
Creatinine, Ser: 0.55 mg/dL (ref 0.44–1.00)
GFR, Estimated: >60 mL/min (ref >60)
Glucose, Bld: 88 mg/dL (ref 70–99)
Potassium: 3.5 mmol/L (ref 3.5–5.1)
Sodium: 138 mmol/L (ref 135–145)
Total Bilirubin: 0.5 mg/dL (ref 0.3–1.2)
Total Protein: 6.3 g/dL — ABNORMAL LOW (ref 6.5–8.1)

## 2022-09-02 LAB — PROTEIN / CREATININE RATIO, URINE
Creatinine, Urine: 35 mg/dL
Total Protein, Urine: <6 mg/dL

## 2022-09-02 SURGERY — Surgical Case
Anesthesia: Spinal | Site: Abdomen | Laterality: Bilateral

## 2022-09-02 MED ORDER — SENNOSIDES-DOCUSATE SODIUM 8.6-50 MG PO TABS
2.0000 | ORAL_TABLET | Freq: Every day | ORAL | Status: DC
  Administered 2022-09-03 – 2022-09-04 (×2): 2 via ORAL
  Filled 2022-09-02 (×2): qty 2

## 2022-09-02 MED ORDER — KETOROLAC TROMETHAMINE 30 MG/ML IJ SOLN: 30.0000 mg | Freq: Four times a day (QID) | INTRAMUSCULAR | Status: AC | PRN

## 2022-09-02 MED ORDER — KETOROLAC TROMETHAMINE 30 MG/ML IJ SOLN
30.0000 mg | Freq: Four times a day (QID) | INTRAMUSCULAR | Status: AC
  Administered 2022-09-02 – 2022-09-03 (×4): 30 mg via INTRAVENOUS
  Filled 2022-09-02 (×4): qty 1

## 2022-09-02 MED ORDER — SIMETHICONE 80 MG PO CHEW: 80.0000 mg | CHEWABLE_TABLET | ORAL | Status: DC | PRN

## 2022-09-02 MED ORDER — SIMETHICONE 80 MG PO CHEW
80.0000 mg | CHEWABLE_TABLET | Freq: Three times a day (TID) | ORAL | Status: DC
  Administered 2022-09-02 – 2022-09-04 (×4): 80 mg via ORAL
  Filled 2022-09-02 (×4): qty 1

## 2022-09-02 MED ORDER — ACETAMINOPHEN 500 MG PO TABS: 1000.0000 mg | ORAL_TABLET | Freq: Four times a day (QID) | ORAL | Status: DC

## 2022-09-02 MED ORDER — COCONUT OIL OIL: 1.0000 | TOPICAL_OIL | Status: DC | PRN

## 2022-09-02 MED ORDER — WITCH HAZEL-GLYCERIN EX PADS: 1.0000 | MEDICATED_PAD | CUTANEOUS | Status: DC | PRN

## 2022-09-02 MED ORDER — DEXAMETHASONE SODIUM PHOSPHATE 10 MG/ML IJ SOLN
INTRAMUSCULAR | Status: DC | PRN
  Administered 2022-09-02: 10 mg via INTRAVENOUS

## 2022-09-02 MED ORDER — FENTANYL CITRATE (PF) 100 MCG/2ML IJ SOLN
INTRAMUSCULAR | Status: AC
  Filled 2022-09-02: qty 2

## 2022-09-02 MED ORDER — ACETAMINOPHEN 160 MG/5ML PO SOLN: 1000.0000 mg | Freq: Once | ORAL | Status: DC

## 2022-09-02 MED ORDER — ONDANSETRON HCL 4 MG/2ML IJ SOLN: 4.0000 mg | Freq: Three times a day (TID) | INTRAMUSCULAR | Status: DC | PRN

## 2022-09-02 MED ORDER — MORPHINE SULFATE (PF) 0.5 MG/ML IJ SOLN
INTRAMUSCULAR | Status: AC
  Filled 2022-09-02: qty 10

## 2022-09-02 MED ORDER — DEXAMETHASONE SODIUM PHOSPHATE 10 MG/ML IJ SOLN
INTRAMUSCULAR | Status: AC
  Filled 2022-09-02: qty 1

## 2022-09-02 MED ORDER — DIBUCAINE (PERIANAL) 1 % EX OINT: 1.0000 | TOPICAL_OINTMENT | CUTANEOUS | Status: DC | PRN

## 2022-09-02 MED ORDER — OXYTOCIN-SODIUM CHLORIDE 30-0.9 UT/500ML-% IV SOLN
2.5000 [IU]/h | INTRAVENOUS | Status: AC
  Administered 2022-09-02: 2.5 [IU]/h via INTRAVENOUS
  Filled 2022-09-02: qty 500

## 2022-09-02 MED ORDER — DROPERIDOL 2.5 MG/ML IJ SOLN: 0.6250 mg | Freq: Once | INTRAMUSCULAR | Status: DC | PRN

## 2022-09-02 MED ORDER — DULOXETINE HCL 20 MG PO CPEP
40.0000 mg | ORAL_CAPSULE | Freq: Every day | ORAL | Status: DC
  Administered 2022-09-02 – 2022-09-03 (×2): 40 mg via ORAL
  Filled 2022-09-02 (×3): qty 2

## 2022-09-02 MED ORDER — PHENYLEPHRINE HCL-NACL 20-0.9 MG/250ML-% IV SOLN
INTRAVENOUS | Status: AC
  Filled 2022-09-02: qty 250

## 2022-09-02 MED ORDER — SODIUM CHLORIDE 0.9% FLUSH: 3.0000 mL | INTRAVENOUS | Status: DC | PRN

## 2022-09-02 MED ORDER — DIPHENHYDRAMINE HCL 50 MG/ML IJ SOLN: 12.5000 mg | INTRAMUSCULAR | Status: DC | PRN

## 2022-09-02 MED ORDER — TRANEXAMIC ACID-NACL 1000-0.7 MG/100ML-% IV SOLN
INTRAVENOUS | Status: DC | PRN
  Administered 2022-09-02: 1000 mg via INTRAVENOUS

## 2022-09-02 MED ORDER — LACTATED RINGERS IV SOLN: INTRAVENOUS | Status: DC

## 2022-09-02 MED ORDER — ONDANSETRON HCL 4 MG/2ML IJ SOLN
INTRAMUSCULAR | Status: AC
  Filled 2022-09-02: qty 2

## 2022-09-02 MED ORDER — POVIDONE-IODINE 10 % EX SWAB
2.0000 | Freq: Once | CUTANEOUS | Status: AC
  Administered 2022-09-02: 2 via TOPICAL

## 2022-09-02 MED ORDER — ACETAMINOPHEN 500 MG PO TABS: 1000.0000 mg | ORAL_TABLET | Freq: Once | ORAL | Status: DC

## 2022-09-02 MED ORDER — ACETAMINOPHEN 10 MG/ML IV SOLN
INTRAVENOUS | Status: DC | PRN
  Administered 2022-09-02: 1000 mg via INTRAVENOUS

## 2022-09-02 MED ORDER — DIPHENHYDRAMINE HCL 25 MG PO CAPS: 25.0000 mg | ORAL_CAPSULE | ORAL | Status: DC | PRN

## 2022-09-02 MED ORDER — OXYTOCIN-SODIUM CHLORIDE 30-0.9 UT/500ML-% IV SOLN
INTRAVENOUS | Status: AC
  Filled 2022-09-02: qty 500

## 2022-09-02 MED ORDER — CEFAZOLIN SODIUM-DEXTROSE 2-4 GM/100ML-% IV SOLN
INTRAVENOUS | Status: AC
  Filled 2022-09-02: qty 100

## 2022-09-02 MED ORDER — NALOXONE HCL 4 MG/10ML IJ SOLN: 1.0000 ug/kg/h | INTRAVENOUS | Status: DC | PRN

## 2022-09-02 MED ORDER — DIPHENHYDRAMINE HCL 25 MG PO CAPS: 25.0000 mg | ORAL_CAPSULE | Freq: Four times a day (QID) | ORAL | Status: DC | PRN

## 2022-09-02 MED ORDER — BUPIVACAINE IN DEXTROSE 0.75-8.25 % IT SOLN
INTRATHECAL | Status: DC | PRN
  Administered 2022-09-02: 1.6 mL via INTRATHECAL

## 2022-09-02 MED ORDER — ZOLPIDEM TARTRATE 5 MG PO TABS: 5.0000 mg | ORAL_TABLET | Freq: Every evening | ORAL | Status: DC | PRN

## 2022-09-02 MED ORDER — DEXTROSE IN LACTATED RINGERS 5 % IV SOLN: INTRAVENOUS | Status: DC

## 2022-09-02 MED ORDER — MENTHOL 3 MG MT LOZG: 1.0000 | LOZENGE | OROMUCOSAL | Status: DC | PRN

## 2022-09-02 MED ORDER — SODIUM CHLORIDE 0.9 % IR SOLN
Status: DC | PRN
  Administered 2022-09-02: 1000 mL

## 2022-09-02 MED ORDER — FENTANYL CITRATE (PF) 100 MCG/2ML IJ SOLN
INTRAMUSCULAR | Status: DC | PRN
  Administered 2022-09-02: 15 ug via INTRATHECAL

## 2022-09-02 MED ORDER — OXYCODONE HCL 5 MG PO TABS: 5.0000 mg | ORAL_TABLET | ORAL | Status: DC | PRN

## 2022-09-02 MED ORDER — IBUPROFEN 600 MG PO TABS
600.0000 mg | ORAL_TABLET | Freq: Four times a day (QID) | ORAL | Status: DC
  Administered 2022-09-03 – 2022-09-04 (×4): 600 mg via ORAL
  Filled 2022-09-02 (×4): qty 1

## 2022-09-02 MED ORDER — MORPHINE SULFATE (PF) 0.5 MG/ML IJ SOLN
INTRAMUSCULAR | Status: DC | PRN
  Administered 2022-09-02: 150 ug via INTRATHECAL

## 2022-09-02 MED ORDER — NALOXONE HCL 0.4 MG/ML IJ SOLN: 0.4000 mg | INTRAMUSCULAR | Status: DC | PRN

## 2022-09-02 MED ORDER — CEFAZOLIN SODIUM-DEXTROSE 2-4 GM/100ML-% IV SOLN
2.0000 g | INTRAVENOUS | Status: AC
  Administered 2022-09-02: 2 g via INTRAVENOUS

## 2022-09-02 MED ORDER — ACETAMINOPHEN 500 MG PO TABS
1000.0000 mg | ORAL_TABLET | Freq: Four times a day (QID) | ORAL | Status: DC
  Administered 2022-09-02 – 2022-09-04 (×7): 1000 mg via ORAL
  Filled 2022-09-02 (×8): qty 2

## 2022-09-02 MED ORDER — KETOROLAC TROMETHAMINE 30 MG/ML IJ SOLN
30.0000 mg | Freq: Once | INTRAMUSCULAR | Status: AC
  Administered 2022-09-02: 30 mg via INTRAVENOUS

## 2022-09-02 MED ORDER — PRENATAL MULTIVITAMIN CH
1.0000 | ORAL_TABLET | Freq: Every day | ORAL | Status: DC
  Administered 2022-09-04: 1 via ORAL
  Filled 2022-09-02 (×2): qty 1

## 2022-09-02 MED ORDER — OXYTOCIN-SODIUM CHLORIDE 30-0.9 UT/500ML-% IV SOLN
INTRAVENOUS | Status: DC | PRN
  Administered 2022-09-02: 30 [IU] via INTRAVENOUS

## 2022-09-02 MED ORDER — STERILE WATER FOR IRRIGATION IR SOLN
Status: DC | PRN
  Administered 2022-09-02: 1000 mL

## 2022-09-02 MED ORDER — ONDANSETRON HCL 4 MG/2ML IJ SOLN
INTRAMUSCULAR | Status: DC | PRN
  Administered 2022-09-02: 4 mg via INTRAVENOUS

## 2022-09-02 MED ORDER — KETOROLAC TROMETHAMINE 30 MG/ML IJ SOLN
INTRAMUSCULAR | Status: AC
  Filled 2022-09-02: qty 1

## 2022-09-02 MED ORDER — PHENYLEPHRINE HCL-NACL 20-0.9 MG/250ML-% IV SOLN
INTRAVENOUS | Status: DC | PRN
  Administered 2022-09-02: 60 ug/min via INTRAVENOUS

## 2022-09-02 MED ORDER — TRANEXAMIC ACID-NACL 1000-0.7 MG/100ML-% IV SOLN
INTRAVENOUS | Status: AC
  Filled 2022-09-02: qty 100

## 2022-09-02 MED ORDER — FENTANYL CITRATE (PF) 100 MCG/2ML IJ SOLN: 25.0000 ug | INTRAMUSCULAR | Status: DC | PRN

## 2022-09-02 SURGICAL SUPPLY — 37 items
CHLORAPREP W/TINT 26ML (MISCELLANEOUS) ×2 IMPLANT
CLAMP UMBILICAL CORD (MISCELLANEOUS) ×1 IMPLANT
CLOTH BEACON ORANGE TIMEOUT ST (SAFETY) ×1 IMPLANT
DERMABOND ADVANCED .7 DNX12 (GAUZE/BANDAGES/DRESSINGS) ×1 IMPLANT
DRSG OPSITE POSTOP 4X10 (GAUZE/BANDAGES/DRESSINGS) ×1 IMPLANT
ELECT REM PT RETURN 9FT ADLT (ELECTROSURGICAL) ×1
ELECTRODE REM PT RTRN 9FT ADLT (ELECTROSURGICAL) ×1 IMPLANT
EXTRACTOR VACUUM BELL STYLE (SUCTIONS) IMPLANT
GAUZE SPONGE 4X4 12PLY STRL LF (GAUZE/BANDAGES/DRESSINGS) IMPLANT
GLOVE BIO SURGEON STRL SZ 6.5 (GLOVE) ×1 IMPLANT
GLOVE BIOGEL PI IND STRL 6.5 (GLOVE) ×1 IMPLANT
GLOVE BIOGEL PI IND STRL 7.0 (GLOVE) ×2 IMPLANT
GOWN STRL REUS W/TWL LRG LVL3 (GOWN DISPOSABLE) ×2 IMPLANT
KIT ABG SYR 3ML LUER SLIP (SYRINGE) IMPLANT
LIGASURE IMPACT 36 18CM CVD LR (INSTRUMENTS) IMPLANT
NDL HYPO 25X5/8 SAFETYGLIDE (NEEDLE) IMPLANT
NEEDLE HYPO 25X5/8 SAFETYGLIDE (NEEDLE) IMPLANT
NS IRRIG 1000ML POUR BTL (IV SOLUTION) ×1 IMPLANT
PACK C SECTION WH (CUSTOM PROCEDURE TRAY) ×1 IMPLANT
PAD ABD 8X10 STRL (GAUZE/BANDAGES/DRESSINGS) IMPLANT
PAD OB MATERNITY 4.3X12.25 (PERSONAL CARE ITEMS) ×1 IMPLANT
PENCIL SMOKE EVAC W/HOLSTER (ELECTROSURGICAL) ×1 IMPLANT
RETRACTOR TRAXI PANNICULUS (MISCELLANEOUS) IMPLANT
RTRCTR C-SECT PINK 25CM LRG (MISCELLANEOUS) IMPLANT
SUT PDS AB 0 CTX 36 PDP370T (SUTURE) IMPLANT
SUT PLAIN 2 0 (SUTURE)
SUT PLAIN 2 0 XLH (SUTURE) IMPLANT
SUT PLAIN ABS 2-0 CT1 27XMFL (SUTURE) IMPLANT
SUT VIC AB 0 CT1 36 (SUTURE) ×2 IMPLANT
SUT VIC AB 2-0 CT1 27 (SUTURE) ×1
SUT VIC AB 2-0 CT1 TAPERPNT 27 (SUTURE) ×1 IMPLANT
SUT VIC AB 3-0 SH 27 (SUTURE) ×1
SUT VIC AB 3-0 SH 27X BRD (SUTURE) IMPLANT
SUT VIC AB 4-0 KS 27 (SUTURE) ×1 IMPLANT
TOWEL OR 17X24 6PK STRL BLUE (TOWEL DISPOSABLE) ×1 IMPLANT
TRAY FOLEY W/BAG SLVR 14FR LF (SET/KITS/TRAYS/PACK) ×1 IMPLANT
WATER STERILE IRR 1000ML POUR (IV SOLUTION) ×1 IMPLANT

## 2022-09-02 NOTE — H&P (Signed)
Rachel Roy is a 36 y.o. female presenting for scheduled procedure. +FM, denies VB< LOF, CTX. Confirms desire for BL BS. Anxious, denies Pree symptoms PNC c/b  1) H/o csx x1 and PreE - PLTCS for TIUP @ 35wks with severe PreE, received MgSO4, baby ASA this pregnancy, baseline labs WNL 2) Satisfied fertility - bilateral salpingectomy planned 3) BMI, AMA - ASA and low risk NIPT 4) Anxiety - controlled on Cymbalta 20mg  PO BID 5) h/o PPH - plan for TXA Patient states anxious currently, elevated BP on arrival, amenable to PreE labs  OB History     Gravida  4   Para  1   Term  0   Preterm  1   AB  2   Living  2      SAB  2   IAB  0   Ectopic  0   Multiple  1   Live Births  2          Past Medical History:  Diagnosis Date   AKI (acute kidney injury) (HCC) 10/16/2019   Anemia    Anxiety    Blood transfusion without reported diagnosis    GERD (gastroesophageal reflux disease)    Heart murmur    Preeclampsia, third trimester 10/11/2019   Past Surgical History:  Procedure Laterality Date   BREAST SURGERY  2010   reduction   CESAREAN SECTION N/A 10/15/2019   Procedure: CESAREAN SECTION;  Surgeon: 10/17/2019, MD;  Location: [facility]S;  Service: Obstetrics;  Laterality: N/A;   PERINEAL LACERATION REPAIR N/A 10/15/2019   Procedure: SUTURE REPAIR PERINEAL LACERATION;  Surgeon: 10/17/2019, MD;  Location: [facility];  Service: Obstetrics;  Laterality: N/A;   Family History: family history includes Anxiety disorder in her mother; Arthritis in her maternal grandmother; Asthma in her mother; Cancer in her maternal grandfather, mother, and paternal grandmother; Cancer (age of onset: 90) in her paternal grandfather; Glaucoma in her maternal grandmother; Heart disease in her maternal grandfather and maternal grandmother; Hyperlipidemia in her maternal grandfather; Hypertension in her maternal grandfather and mother; Stroke in her maternal grandfather. Social History:   reports that she has never smoked. She has never used smokeless tobacco. She reports that she does not currently use alcohol. She reports that she does not use drugs.     Maternal Diabetes: No elevated 1hr 183, 3hr WNL (0 of 4) Genetic Screening: Normal Maternal Ultrasounds/Referrals: Normal Fetal Ultrasounds or other Referrals:  None Maternal Substance Abuse:  No Significant Maternal Medications:  None Significant Maternal Lab Results:  Group B Strep negative Number of Prenatal Visits:greater than 3 verified prenatal visits Other Comments:  None  Review of Systems  Constitutional:  Negative for chills and fever.  Respiratory:  Negative for shortness of breath.   Cardiovascular:  Negative for chest pain, palpitations and leg swelling.  Gastrointestinal:  Negative for abdominal pain and vomiting.  Neurological:  Negative for dizziness, weakness and headaches.  Psychiatric/Behavioral:  Negative for suicidal ideas. The patient is nervous/anxious.    Maternal Medical History:  Contractions: Frequency: rare.   Fetal activity: Perceived fetal activity is normal.   Prenatal Complications - Diabetes: none.     Blood pressure (!) 151/92, temperature 98.4 F (36.9 C), temperature source Oral, resp. rate 16, height 5\' 7"  (1.702 m), weight 118.9 kg, SpO2 97 %, unknown if currently breastfeeding. Exam Physical Exam Constitutional:      General: She is not in acute distress.    Appearance: She is  well-developed.  HENT:     Head: Normocephalic and atraumatic.  Eyes:     Pupils: Pupils are equal, round, and reactive to light.  Cardiovascular:     Rate and Rhythm: Normal rate and regular rhythm.     Heart sounds: No murmur heard.    No gallop.  Abdominal:     Tenderness: There is no abdominal tenderness. There is no guarding or rebound.  Genitourinary:    Vagina: Normal.  Musculoskeletal:        General: Normal range of motion.     Cervical back: Normal range of motion and neck  supple.  Skin:    General: Skin is warm and dry.  Neurological:     Mental Status: She is alert and oriented to person, place, and time.     Prenatal labs: ABO, Rh: --/--/AB POS (12/18 0945) Antibody: NEG (12/18 0945) Rubella: Immune (06/12 0000) RPR: NON REACTIVE (12/18 0942)  HBsAg: Negative (06/12 0000)  HIV: Non-reactive (06/12 0000)  GBS: Negative/-- (11/30 0000)   Assessment/Plan: This is a 36yo [id number] @ 47 1/7 presenting for scheduled ERLTCS+BS for h/o csx x1, declining TOLAC.  R/B/A of cesarean section discussed with patient. Alternative would be vaginal delivery which would mean shorter postpartum stay and decreased risk of bleeding. Risks of section include infection of the uterus, pelvic organs, or skin, inadvertent injury to internal organs, such as bowel or bladder. If there is major injury, extensive surgery may be required. If injury is minor, it may be treated with relative ease. Discussed possibility of excessive blood loss and transfusion. If bleeding cannot be controlled using medical or minor surgical methods, a cesarean hysterectomy may be performed which would mean no future fertility. Patient accepts the possibility of blood transfusion, if necessary. Patient understands and agrees to move forward with section. Aware of need for IVF should she desire pregnancy after bilateral salpingectomy, desires to move forward  H/o PreE - elevated BP this AM likely 2/2 anxiety, will continue those meds postop. CMP and uPC ordered this AM regardless, pt asymptomatic H/o PPH - plan for intra-op TXA   Melida Quitter Evalynn Hankins 09/02/2022, 8:20 AM

## 2022-09-02 NOTE — Progress Notes (Signed)
BP WNL since delivery, PReE labs WNL. Continue to monitor BP 137/88 (BP Location: Right Arm)   Pulse 94   Temp 98.4 F (36.9 C) (Oral)   Resp 18   Ht 5\' 7"  (1.702 m)   Wt 118.9 kg   SpO2 98%   Breastfeeding Unknown   BMI 41.05 kg/m

## 2022-09-02 NOTE — Lactation Note (Signed)
This note was copied from a baby's chart. Lactation Consultation Note  Patient Name: Rachel Roy [id number] Date: 09/02/2022 Reason for consult: Initial assessment;Breast reduction;Breastfeeding assistance;Term Age:36 hours  LC entered the room and the birth parent was holding the infant.  She stated that he had been latching, but he was not staying on very long.  LC spoke with the birth parent about pre pumping and sandwiching the breast tissue. The birth parent is also aware that she can use a nipple shield.  LC set the birth parent up with the DEBP due to her having a breast reduction in 2010 for stimulation.  The birth parent is aware that she should pump every 3 hours or as needed.  LC reviewed pumping frequency, milk production, washing pump parts, and outpatient LC services. All questions were answered.   Infant Feeding Plan:  Breastfeed 8+ times in 24 hours according to feeding cues.  Pump after feedings or every 3 hours and feed expressed milk to the infant.  Call RN/LC for assistance with breastfeeding.  Maternal Data Has patient been taught Hand Expression?: Yes Does the patient have breastfeeding experience prior to this delivery?: Yes How long did the patient breastfeed?: She pumped with her twins, but did not start stimulating until the twins were 24 week old  Feeding Mother's Current Feeding Choice: Breast Milk  Lactation Tools Discussed/Used Tools: Pump;Flanges Flange Size: 21 Breast pump type: Double-Electric Breast Pump Pump Education: Setup, frequency, and cleaning;Milk Storage Reason for Pumping: The birth parent had a breast reduction in 2010 Pumping frequency: q3hrs or PRN  Interventions Interventions: Breast feeding basics reviewed;DEBP;Education;LC Services brochure  Discharge Pump: DEBP;Personal  Consult Status Consult Status: Follow-up Date: 09/03/22 Follow-up type: In-patient    Delene Loll 09/02/2022, 7:20 PM

## 2022-09-02 NOTE — Anesthesia Postprocedure Evaluation (Signed)
Anesthesia Post Note  Patient: Rachel Roy  Procedure(s) Performed: CESAREAN SECTION WITH BILATERAL TUBAL LIGATION (Bilateral: Abdomen) BILATERAL TUBAL LIGATION (Bilateral: Abdomen)     Patient location during evaluation: PACU Anesthesia Type: Spinal Level of consciousness: awake and alert Pain management: pain level controlled Vital Signs Assessment: post-procedure vital signs reviewed and stable Respiratory status: spontaneous breathing, nonlabored ventilation and respiratory function stable Cardiovascular status: blood pressure returned to baseline Postop Assessment: no apparent nausea or vomiting, spinal receding, no headache and no backache Anesthetic complications: no   No notable events documented.  Last Vitals:  Vitals:   09/02/22 1150 09/02/22 1245  BP: 126/70 125/79  Pulse: 69 72  Resp: 17 16  Temp: 36.8 C 36.7 C  SpO2: 97% 98%    Last Pain:  Vitals:   09/02/22 1245  TempSrc: Oral  PainSc:    Pain Goal: Patients Stated Pain Goal: 5 (09/02/22 1030)                 Shanda Howells

## 2022-09-02 NOTE — Transfer of Care (Signed)
Immediate Anesthesia Transfer of Care Note  Patient: Rachel Roy  Procedure(s) Performed: CESAREAN SECTION WITH BILATERAL TUBAL LIGATION (Bilateral: Abdomen) BILATERAL TUBAL LIGATION (Bilateral: Abdomen)  Patient Location: PACU  Anesthesia Type:Spinal  Level of Consciousness: awake, alert , and oriented  Airway & Oxygen Therapy: Patient Spontanous Breathing  Post-op Assessment: Report given to RN and Post -op Vital signs reviewed and stable  Post vital signs: Reviewed and stable  Last Vitals:  Vitals Value Taken Time  BP    Temp    Pulse    Resp    SpO2      Last Pain:  Vitals:   09/02/22 0650  TempSrc:   PainSc: 0-No pain         Complications: No notable events documented.

## 2022-09-02 NOTE — Op Note (Signed)
C-Section Operative Note  Date: 09/02/22  Preoperative Diagnosis: IUP @ 39 1/7, history of csx x1, history of postpartum hemorrhage Postoperative Diagnosis: same as above Procedure: ERLTCS+BS Indications" Declining TOLAC, satisfied fertility Findings: Viable female infant weighing 3000 with APGARS of 8 and 8 at 1 and 5 minutes, respectively. Normal appearing uterus, bilateral fallopian tubes and ovaries. Specimens: placenta and bilateral fallopian tubes  EBL 456 IVF 2200 UOP 200  Patient Course: Patient with h/o CSX x1 at 35wks for TIUP and severe PreE, complicated by PPH. Satisfied fertility. Declining TOLAC, desires bilateral salpingectomy  Consent:  R/B/A of cesarean section discussed with patient. Alternative would be vaginal delivery which would mean shorter postpartum stay and decreased risk of bleeding. Risks of section include infection of the uterus, pelvic organs, or skin, inadvertent injury to internal organs, such as bowel or bladder. If there is major injury, extensive surgery may be required. If injury is minor, it may be treated with relative ease. Discussed possibility of excessive blood loss and transfusion. If bleeding cannot be controlled using medical or minor surgical methods, a cesarean hysterectomy may be performed which would mean no future fertility. Patient accepts the possibility of blood transfusion, if necessary. Patient understands and agrees to move forward with section.   Operative Procedure: Patient was taken to the operating room where epidural anesthesia was found to be adequate by Allis clamp test. She was prepped and draped in the normal sterile fashion in the dorsal supine position with a leftward tilt. An appropriate time out was performed. A Pfannenstiel skin incision was then made with the scalpel and carried through to the underlying layer of fascia by sharp dissection and Bovie cautery. The fascia was nicked in the midline and the incision was extended  laterally with Mayo scissors. The superior aspect of the incision was grasped Coker clamps and dissected off the underlying rectus muscles. In a similar fashion the inferior aspect was dissected off the rectus muscles. Rectus muscles were separated in the midline and the peritoneal cavity entered bluntly. The peritoneal incision was then extended both superiorly and inferiorly with careful attention to avoid both bowel and bladder. The Alexis self-retaining wound retractor was then placed within the incision and the lower uterine segment exposed. The bladder flap was developed with Metzenbaum scissors and pushed away from the lower uterine segment. The lower uterine segment was then incised in a low transverse fashion and the cavity itself entered bluntly. The incision was extended bluntly. Amniotic sac was ruptured and fluid was noted to be light green and semi particulate in color consistent with meconium. Loose nuchal x1, easily reduced, true knot noted in cord which was lengthy. The infant's head was then lifted and delivered from the incision without difficulty using the standard movements. The remainder of the infant delivered and the nose and mouth bulb suctioned with the cord clamped and cut as well. The infant was handed off to NICU. The placenta was then spontaneously expressed from the uterus and the uterus cleared of all clots and debris with moist lap sponge. The uterine incision was then repaired in 2 layers the first layer was a running locked layer of 0-vicryl and the second an imbricating layer of the same suture. The tubes and ovaries were inspected and the gutters cleared of all clots and debris. The uterine incision was inspected and found to be hemostatic. All instruments and sponges as well as the Alexis retractor were then removed from the abdomen. The rectus muscles were then reapproximated with  several interrupted mattress sutures of 2-0 Vicryl. The fascia was then closed with 0 PDS in a  running fashion. Subcutaneous tissue was reapproximated with 3-0 plain in a running fashion. The skin was closed with a subcuticular stitch of 4-0 Vicryl on a Keith needle and then reinforced with Dermabond and a Honeycomb. At the conclusion of the procedure all instruments and sponge counts were correct. Patient was taken to the recovery room in good condition with her baby accompanying her skin to skin.

## 2022-09-02 NOTE — Anesthesia Procedure Notes (Signed)
Spinal  Patient location during procedure: OR Start time: 09/02/2022 8:36 AM End time: 09/02/2022 8:39 AM Reason for block: surgical anesthesia Staffing Performed: anesthesiologist  Anesthesiologist: Kaylyn Layer, MD Performed by: Kaylyn Layer, MD Authorized by: Kaylyn Layer, MD   Preanesthetic Checklist Completed: patient identified, IV checked, risks and benefits discussed, monitors and equipment checked, pre-op evaluation and timeout performed Spinal Block Patient position: sitting Prep: DuraPrep and site prepped and draped Patient monitoring: heart rate, continuous pulse ox and blood pressure Approach: midline Location: L3-4 Injection technique: single-shot Needle Needle type: Pencan  Needle gauge: 24 G Needle length: 10 cm Assessment Sensory level: T4 Events: CSF return Additional Notes Risks, benefits, and alternative discussed. Patient gave consent to procedure. Prepped and draped in sitting position. Clear CSF obtained after one needle pass however needle would not aspirate despite free flow, re-approached at same site with free flow and positive aspiration prior to injection. Positive terminal aspiration. No pain or paraesthesias with injection. Patient tolerated procedure well. Vital signs stable. Amalia Greenhouse, MD

## 2022-09-03 LAB — CBC
HCT: 28.8 % — ABNORMAL LOW (ref 36.0–46.0)
Hemoglobin: 9.2 g/dL — ABNORMAL LOW (ref 12.0–15.0)
MCH: 26.6 pg (ref 26.0–34.0)
MCHC: 31.9 g/dL (ref 30.0–36.0)
MCV: 83.2 fL (ref 80.0–100.0)
Platelets: 204 10*3/uL (ref 150–400)
RBC: 3.46 MIL/uL — ABNORMAL LOW (ref 3.87–5.11)
RDW: 15.3 % (ref 11.5–15.5)
WBC: 12.4 10*3/uL — ABNORMAL HIGH (ref 4.0–10.5)
nRBC: 0 % (ref 0.0–0.2)

## 2022-09-03 LAB — SURGICAL PATHOLOGY

## 2022-09-03 NOTE — Progress Notes (Signed)
MOB was referred for history of depression/anxiety. * Referral screened out by Clinical Social Worker because none of the following criteria appear to apply: ~ History of anxiety/depression during this pregnancy, or of post-partum depression following prior delivery. ~ Diagnosis of anxiety and/or depression within last 3 years OR * MOB's symptoms currently being treated with medication and/or therapy. Per chart review, MOB is currently prescribed/taking Cymbalta.   Please contact the Clinical Social Worker if needs arise, by [facility] request, or if MOB scores greater than 9/yes to question 10 on Edinburgh Postpartum Depression Screen.  Celso Sickle, LCSW Clinical Social Worker [facility] Cell#: [phone]

## 2022-09-03 NOTE — Progress Notes (Signed)
Dr. Chestine Spore said only call if BP is 150/100 or greater.

## 2022-09-03 NOTE — Progress Notes (Addendum)
Patient is doing well.  She is tolerating PO, ambulating, voiding.  Pain is controlled.  Lochia is appropriate  Vitals:   09/02/22 1530 09/02/22 1950 09/02/22 2350 09/03/22 0350  BP:  121/75 127/81 126/82  Pulse:  75 77 88  Resp: 18 18 18 18   Temp: 98.4 F (36.9 C) 97.7 F (36.5 C) 98.2 F (36.8 C) 97.7 F (36.5 C)  TempSrc: Oral Oral Oral Oral  SpO2: 98% 100% 97% 98%  Weight:      Height:        NAD Abdomen:  actively feeding baby, unable to examine, nursing denies concerns ext:    Symmetric, trace edema bilaterally  Lab Results  Component Value Date   WBC 8.4 08/31/2022   HGB 12.0 08/31/2022   HCT 36.1 08/31/2022   MCV 80.6 08/31/2022   PLT 245 08/31/2022    --/--/AB POS (12/18 0945)  A/P    36 y.o. 04-28-1996 POD#1 s/p RCS Routine post op and postpartum care.   Anticipate discharge in 1-2 days  Desires circumcision.   Discussed r/b/a of the procedure.  Reviewed that circumcision is an elective surgical procedure and not considered medically necessary.  Reviewed the risks of the procedure including the risk of infection, bleeding, damage to surrounding structures, including scrotum, shaft, urethra and head of penis, and an undesired cosmetic effect requiring additional procedures for revision.  Consent signed.   Addendum: baby with episodes of retractions / grunting this AM.  Nursery asks that we defer circumcision until tomorrow

## 2022-09-04 MED ORDER — IBUPROFEN 600 MG PO TABS: 600.0000 mg | ORAL_TABLET | Freq: Four times a day (QID) | ORAL | 1 refills | Status: AC | PRN

## 2022-09-04 MED ORDER — OXYCODONE-ACETAMINOPHEN 5-325 MG PO TABS: 1.0000 | ORAL_TABLET | ORAL | 0 refills | Status: AC | PRN

## 2022-09-04 NOTE — Discharge Instructions (Signed)
Call office with any concerns [phone] 

## 2022-09-04 NOTE — Progress Notes (Signed)
Subjective: Postpartum Day 2: Cesarean Delivery Patient reports incisional pain, tolerating PO, + flatus, + BM, and no problems voiding. Lochia mild. She denies HA, CP, SOB or dizziness. Bonding well with baby- breast/bottlefeeding. She desires circumcision for baby and discharge home today.     Objective: Vital signs in last 24 hours: Temp:  [97.9 F (36.6 C)-98.4 F (36.9 C)] 98.4 F (36.9 C) (12/22 0634) Pulse Rate:  [78-90] 89 (12/22 0634) Resp:  [18] 18 (12/22 0634) BP: (119-139)/(72-89) 119/72 (12/22 0634) SpO2:  [98 %] 98 % (12/21 1412)  Physical Exam:  General: alert, cooperative, and no distress Lochia: appropriate Uterine Fundus: firm Incision: no significant drainage DVT Evaluation: No evidence of DVT seen on physical exam. Calf/Ankle edema is present.  Recent Labs    09/03/22 0518  HGB 9.2*  HCT 28.8*    Assessment/Plan: Status post Cesarean section. Doing well postoperatively.  Discharge home with standard precautions and return to clinic in 2 weeks for incision check and 6 weeks for postpartum visit  Discussed circumcision : Pt understands that neonatal circumcision is not considered medically necessary and is elective. The risks include, but are not limited to bleeding, infection, damage to the penis, development of scar tissue, and having to have it redone at a later date. Pt understands theses risks and wishes to proceed.  Cathrine Muster, DO 09/04/2022, 12:22 PM

## 2022-09-04 NOTE — Lactation Note (Signed)
This note was copied from a baby's chart. Lactation Consultation Note  Patient Name: Rachel Roy Date: 09/04/2022   Age:36 hours Bper RN [name]), Birth Parent doesn't want to be seen by [facility] services tonight. Maternal Data    Feeding    LATCH Score                    Lactation Tools Discussed/Used    Interventions    Discharge    Consult Status      Rachel Roy 09/04/2022, 2:33 AM

## 2022-09-04 NOTE — Discharge Summary (Signed)
Postpartum Discharge Summary  Date of Service updated      Patient Name: Rachel Roy DOB: Feb 03, 1986 MRN: [id number]  Date of admission: 09/02/2022 Delivery date:09/02/2022  Delivering provider: Carlisle Cater  Date of discharge: 09/04/2022  Admitting diagnosis: History of cesarean section [Z98.891] Intrauterine pregnancy: [redacted]w[redacted]d     Secondary diagnosis:  Principal Problem:   History of cesarean section  Additional problems: history of preecclampsia     Discharge diagnosis: Term Pregnancy Delivered                                              Post partum procedures: n/a Augmentation: N/A Complications: None  Hospital course: Sceduled C/S   36 y.o. yo [id number] at [redacted]w[redacted]d was admitted to the hospital 09/02/2022 for scheduled cesarean section with the following indication:Elective Repeat.Delivery details are as follows:  Membrane Rupture Time/Date: 9:11 AM ,09/02/2022   Delivery Method:C-Section, Low Transverse  Details of operation can be found in separate operative note.  Patient had a postpartum course complicated byn/a.  She is ambulating, tolerating a regular diet, passing flatus, and urinating well. Patient is discharged home in stable condition on  09/04/22        Newborn Data: Birth date:09/02/2022  Birth time:9:11 AM  Gender:Female  Living status:Living  Apgars:8 ,8  Weight:3000 g     Magnesium Sulfate received: No BMZ received: No  Physical exam  Vitals:   09/03/22 1535 09/03/22 1957 09/03/22 2200 09/04/22 0634  BP: 137/89 137/88 126/78 119/72  Pulse: 87 83 78 89  Resp:  18  18  Temp:  98.1 F (36.7 C) 98.1 F (36.7 C) 98.4 F (36.9 C)  TempSrc:  Oral Axillary Oral  SpO2:      Weight:      Height:       General: alert, cooperative, and no distress Lochia: appropriate Uterine Fundus: firm Incision: Healing well with no significant drainage DVT Evaluation: No evidence of DVT seen on physical exam. Labs: Lab Results  Component Value Date   WBC  12.4 (H) 09/03/2022   HGB 9.2 (L) 09/03/2022   HCT 28.8 (L) 09/03/2022   MCV 83.2 09/03/2022   PLT 204 09/03/2022      Latest Ref Rng & Units 09/02/2022    8:32 AM  CMP  Glucose 70 - 99 mg/dL 88   BUN 6 - 20 mg/dL 7   Creatinine 5.46 - 2.70 mg/dL 3.50   Sodium 093 - 818 mmol/L 138   Potassium 3.5 - 5.1 mmol/L 3.5   Chloride 98 - 111 mmol/L 107   CO2 22 - 32 mmol/L 21   Calcium 8.9 - 10.3 mg/dL 8.9   Total Protein 6.5 - 8.1 g/dL 6.3   Total Bilirubin 0.3 - 1.2 mg/dL 0.5   Alkaline Phos 38 - 126 U/L 103   AST 15 - 41 U/L 18   ALT 0 - 44 U/L 11    Edinburgh Score:    09/02/2022   11:50 AM  Edinburgh Postnatal Depression Scale Screening Tool  I have been able to laugh and see the funny side of things. 1  I have looked forward with enjoyment to things. 0  I have blamed myself unnecessarily when things went wrong. 1  I have been anxious or worried for no good reason. 2  I have felt scared or panicky for no good  reason. 2  Things have been getting on top of me. 1  I have been so unhappy that I have had difficulty sleeping. 0  I have felt sad or miserable. 1  I have been so unhappy that I have been crying. 0  The thought of harming myself has occurred to me. 0  Edinburgh Postnatal Depression Scale Total 8      After visit meds:  Allergies as of 09/04/2022   No Known Allergies      Medication List     STOP taking these medications    aspirin EC 81 MG tablet       TAKE these medications    DULoxetine 60 MG capsule Commonly known as: CYMBALTA Take 1 capsule (60 mg total) by mouth daily. What changed:  how much to take additional instructions   ferrous sulfate 324 MG Tbec Take 324 mg by mouth every other day.   ibuprofen 600 MG tablet Commonly known as: ADVIL Take 1 tablet (600 mg total) by mouth every 6 (six) hours as needed for moderate pain or cramping.   oxyCODONE-acetaminophen 5-325 MG tablet Commonly known as: Percocet Take 1 tablet by mouth  every 4 (four) hours as needed for up to 5 days for severe pain.   prenatal multivitamin Tabs tablet Take 1 tablet by mouth daily at 12 noon.         Discharge home in stable condition Infant Feeding: Bottle and Breast Infant Disposition:home with mother Discharge instruction: per After Visit Summary and Postpartum booklet. Activity: Advance as tolerated. Pelvic rest for 6 weeks.  Diet: low salt diet Anticipated Birth Control: BTL done PP Postpartum Appointment:6 weeks Additional Postpartum F/U: Incision check 2 weeks  Future Appointments:No future appointments. Follow up Visit:  [place]. Schedule an appointment as soon as possible for a visit.   Why: 2 weeks for incision check and 6 weeks for postpartum visit Contact information: [address] [place] [address] [phone]                     09/04/2022 Isaiah Serge, DO

## 2022-09-04 NOTE — Lactation Note (Signed)
This note was copied from a baby's chart. Lactation Consultation Note  Patient Name: Boy Stepahnie Campo CHENI'D Date: 09/04/2022 Reason for consult: Follow-up assessment;Term;Infant weight loss;Breast reduction Age:36 hours Baby asleep and per mom recently fed.  Per mom baby Has been latching. Last  fed at 0820 25 mins , wet at 8:15 am . And supplemented 40 ml of formula. Pumping as recommended and  none this am. LC reassured mom pumping can be a slow process.  LC reviewed BF D/C teaching for BF with reduction and extra pumping for stimulation.  LC recommended LC O/P F/U. See below for details.  Per mom has a DEBP at home.   Maternal Data    Feeding Mother's Current Feeding Choice: Breast Milk and Formula  LATCH Score - per mom the baby recently fed - LC updated the doc flow sheets    Lactation Tools Discussed/Used Tools: Pump Flange Size: 21 Breast pump type: Double-Electric Breast Pump;Manual Pump Education: Milk Storage  Interventions Interventions: Breast feeding basics reviewed;Education;DEBP;LC Services brochure  Discharge Discharge Education: Engorgement and breast care;Outpatient recommendation;Outpatient Epic message sent (mom plans to check with Selinda Michaels for [facility] . LC also will place a request for LC O/P [facility] - just in case  LC at [facility] aren't available.) Pump: DEBP;Personal  Consult Status Consult Status: Complete Date: 09/04/22    Kathrin Greathouse 09/04/2022, 10:37 AM

## 2022-09-08 ENCOUNTER — Encounter (HOSPITAL_COMMUNITY): Payer: Self-pay | Admitting: Obstetrics and Gynecology

## 2022-09-08 ENCOUNTER — Inpatient Hospital Stay (HOSPITAL_COMMUNITY)
Admission: AD | Admit: 2022-09-08 | Discharge: 2022-09-08 | Disposition: A | Discharge status: Home / Self Care | Payer: 59 | Attending: Obstetrics and Gynecology | Admitting: Obstetrics and Gynecology

## 2022-09-08 ENCOUNTER — Other Ambulatory Visit: Payer: Self-pay

## 2022-09-08 DIAGNOSIS — O99893 Other specified diseases and conditions complicating puerperium: Secondary | ICD-10-CM | POA: Insufficient documentation

## 2022-09-08 DIAGNOSIS — R519 Headache, unspecified: Secondary | ICD-10-CM | POA: Insufficient documentation

## 2022-09-08 DIAGNOSIS — Z3A00 Weeks of gestation of pregnancy not specified: Secondary | ICD-10-CM | POA: Diagnosis not present

## 2022-09-08 DIAGNOSIS — Z79899 Other long term (current) drug therapy: Secondary | ICD-10-CM | POA: Insufficient documentation

## 2022-09-08 DIAGNOSIS — O169 Unspecified maternal hypertension, unspecified trimester: Secondary | ICD-10-CM | POA: Diagnosis not present

## 2022-09-08 DIAGNOSIS — O165 Unspecified maternal hypertension, complicating the puerperium: Secondary | ICD-10-CM | POA: Diagnosis not present

## 2022-09-08 LAB — CBC
HCT: 34.9 % — ABNORMAL LOW (ref 36.0–46.0)
Hemoglobin: 11.2 g/dL — ABNORMAL LOW (ref 12.0–15.0)
MCH: 26.3 pg (ref 26.0–34.0)
MCHC: 32.1 g/dL (ref 30.0–36.0)
MCV: 81.9 fL (ref 80.0–100.0)
Platelets: 314 10*3/uL (ref 150–400)
RBC: 4.26 MIL/uL (ref 3.87–5.11)
RDW: 15.1 % (ref 11.5–15.5)
WBC: 8.1 10*3/uL (ref 4.0–10.5)
nRBC: 0 % (ref 0.0–0.2)

## 2022-09-08 LAB — COMPREHENSIVE METABOLIC PANEL
ALT: 46 U/L — ABNORMAL HIGH (ref 0–44)
AST: 33 U/L (ref 15–41)
Albumin: 2.9 g/dL — ABNORMAL LOW (ref 3.5–5.0)
Alkaline Phosphatase: 78 U/L (ref 38–126)
Anion gap: 8 (ref 5–15)
BUN: 5 mg/dL — ABNORMAL LOW (ref 6–20)
CO2: 24 mmol/L (ref 22–32)
Calcium: 8.7 mg/dL — ABNORMAL LOW (ref 8.9–10.3)
Chloride: 108 mmol/L (ref 98–111)
Creatinine, Ser: 0.61 mg/dL (ref 0.44–1.00)
GFR, Estimated: >60 mL/min (ref >60)
Glucose, Bld: 90 mg/dL (ref 70–99)
Potassium: 3.6 mmol/L (ref 3.5–5.1)
Sodium: 140 mmol/L (ref 135–145)
Total Bilirubin: 0.3 mg/dL (ref 0.3–1.2)
Total Protein: 6.8 g/dL (ref 6.5–8.1)

## 2022-09-08 MED ORDER — NIFEDIPINE ER OSMOTIC RELEASE 30 MG PO TB24
30.0000 mg | ORAL_TABLET | Freq: Once | ORAL | Status: AC
  Administered 2022-09-08: 30 mg via ORAL
  Filled 2022-09-08: qty 1

## 2022-09-08 MED ORDER — NIFEDIPINE ER OSMOTIC RELEASE 30 MG PO TB24: 30.0000 mg | ORAL_TABLET | Freq: Every day | ORAL | 1 refills | Status: AC

## 2022-09-08 MED ORDER — FUROSEMIDE 20 MG PO TABS: 20.0000 mg | ORAL_TABLET | Freq: Two times a day (BID) | ORAL | 0 refills | Status: AC

## 2022-09-08 NOTE — MAU Provider Note (Signed)
History     CSN: [id number]  Arrival date and time: 09/08/22 1023   Event Date/Time   First Provider Initiated Contact with Patient 09/08/22 1104      Chief Complaint  Patient presents with   Headache   Hypertension   Rachel Roy is a 36 y.o. [id number] who is  6 days PP after a RLTCS. She states that she had a headache last night and felt "odd" so she took her blood pressure last night and it was 154/97. She took it again today and it was 159/99 this morning and then 161/105 a little later. She was advised to come in at that time. She took it again just before coming in and it was 167/112.   She states that she has taken ibuprofen this morning around 0830. She reports that her headache has improved at this time. Last night she states that she was feeling "odd" and she states that she was feeling really anxious because she had the headache. Other than that she doesn't have a way to describe how she was feeling. She denies any RUQ pain, visual disturbance or edema.   She also reports that she has been under a lot of stress bc her twins have the flu and she has been separated from them for the last few days.     OB History     Gravida  4   Para  2   Term  1   Preterm  1   AB  2   Living  3      SAB  2   IAB  0   Ectopic  0   Multiple  1   Live Births  3           Past Medical History:  Diagnosis Date   AKI (acute kidney injury) (HCC) 10/16/2019   Anemia    Anxiety    Blood transfusion without reported diagnosis    GERD (gastroesophageal reflux disease)    Heart murmur    Preeclampsia, third trimester 10/11/2019    Past Surgical History:  Procedure Laterality Date   BREAST SURGERY  2010   reduction   CESAREAN SECTION N/A 10/15/2019   Procedure: CESAREAN SECTION;  Surgeon: Tereso Newcomer, MD;  Location: [facility] LD ORS;  Service: Obstetrics;  Laterality: N/A;   CESAREAN SECTION WITH BILATERAL TUBAL LIGATION Bilateral 09/02/2022   Procedure: CESAREAN  SECTION WITH BILATERAL TUBAL LIGATION;  Surgeon: Carlisle Cater, MD;  Location: [facility] LD ORS;  Service: Obstetrics;  Laterality: Bilateral;   PERINEAL LACERATION REPAIR N/A 10/15/2019   Procedure: SUTURE REPAIR PERINEAL LACERATION;  Surgeon: Tereso Newcomer, MD;  Location: [facility] LD ORS;  Service: Obstetrics;  Laterality: N/A;   TUBAL LIGATION Bilateral 09/02/2022   Procedure: BILATERAL TUBAL LIGATION;  Surgeon: Carlisle Cater, MD;  Location: [facility] LD ORS;  Service: Obstetrics;  Laterality: Bilateral;    Family History  Problem Relation Age of Onset   Hypertension Mother    Asthma Mother        childhood   Cancer Mother        skin cancer   Anxiety disorder Mother    Arthritis Maternal Grandmother    Glaucoma Maternal Grandmother    Heart disease Maternal Grandmother    Heart disease Maternal Grandfather        died cancer, heart at 45s   Hyperlipidemia Maternal Grandfather    Hypertension Maternal Grandfather    Stroke Maternal Grandfather    Cancer  Maternal Grandfather        skin   Cancer Paternal Grandmother        pancreatic   Cancer Paternal Grandfather 8   Colon cancer Neg Hx     Social History   Tobacco Use   Smoking status: Never   Smokeless tobacco: Never  Vaping Use   Vaping Use: Never used  Substance Use Topics   Alcohol use: Not Currently    Comment: occasionally   Drug use: No    Allergies: No Known Allergies  Medications Prior to Admission  Medication Sig Dispense Refill Last Dose   DULoxetine (CYMBALTA) 60 MG capsule Take 1 capsule (60 mg total) by mouth daily. (Patient taking differently: Take 40 mg by mouth daily. Take (2) 20 mg) 90 capsule 0 09/07/2022   ferrous sulfate 324 MG TBEC Take 324 mg by mouth every other day.   Past Week   Prenatal Vit-Fe Fumarate-FA (PRENATAL MULTIVITAMIN) TABS tablet Take 1 tablet by mouth daily at 12 noon.   Past Week   ibuprofen (ADVIL) 600 MG tablet Take 1 tablet (600 mg total) by mouth every 6 (six) hours as needed  for moderate pain or cramping. 40 tablet 1 More than a month   oxyCODONE-acetaminophen (PERCOCET) 5-325 MG tablet Take 1 tablet by mouth every 4 (four) hours as needed for up to 5 days for severe pain. 28 tablet 0     Review of Systems  All other systems reviewed and are negative.  Physical Exam   Blood pressure (!) 141/99, pulse 82, temperature 98.4 F (36.9 C), temperature source Oral, resp. rate 18, unknown if currently breastfeeding.  Physical Exam Constitutional:      Appearance: She is well-developed.  HENT:     Head: Normocephalic.  Eyes:     Pupils: Pupils are equal, round, and reactive to light.  Cardiovascular:     Rate and Rhythm: Normal rate and regular rhythm.     Heart sounds: Normal heart sounds.  Pulmonary:     Effort: Pulmonary effort is normal. No respiratory distress.     Breath sounds: Normal breath sounds.  Abdominal:     Palpations: Abdomen is soft.     Tenderness: There is no abdominal tenderness.  Genitourinary:    Vagina: No bleeding. Vaginal discharge: mucusy.    Comments: External: no lesion Vagina: small amount of white discharge     Musculoskeletal:        General: Normal range of motion.     Cervical back: Normal range of motion and neck supple.  Skin:    General: Skin is warm and dry.  Neurological:     Mental Status: She is alert and oriented to person, place, and time.  Psychiatric:        Mood and Affect: Mood normal.        Behavior: Behavior normal.      Patient Vitals for the past 24 hrs:  BP Temp Temp src Pulse Resp  09/08/22 1210 (!) 141/99 -- -- 82 18  09/08/22 1146 (!) 148/97 -- -- 82 --  09/08/22 1131 (!) 150/96 -- -- 84 18  09/08/22 1116 (!) 150/97 -- -- 99 --  09/08/22 1101 132/89 -- -- 66 --  09/08/22 1048 (!) 143/96 98.4 F (36.9 C) Oral 80 18    Results for orders placed or performed during the hospital encounter of 09/08/22 (from the past 24 hour(s))  CBC     Status: Abnormal   Collection Time: 09/08/22  11:12  AM  Result Value Ref Range   WBC 8.1 4.0 - 10.5 K/uL   RBC 4.26 3.87 - 5.11 MIL/uL   Hemoglobin 11.2 (L) 12.0 - 15.0 g/dL   HCT 97.4 (L) 16.3 - 84.5 %   MCV 81.9 80.0 - 100.0 fL   MCH 26.3 26.0 - 34.0 pg   MCHC 32.1 30.0 - 36.0 g/dL   RDW 36.4 68.0 - 32.1 %   Platelets 314 150 - 400 K/uL   nRBC 0.0 0.0 - 0.2 %  Comprehensive metabolic panel     Status: Abnormal   Collection Time: 09/08/22 11:12 AM  Result Value Ref Range   Sodium 140 135 - 145 mmol/L   Potassium 3.6 3.5 - 5.1 mmol/L   Chloride 108 98 - 111 mmol/L   CO2 24 22 - 32 mmol/L   Glucose, Bld 90 70 - 99 mg/dL   BUN 5 (L) 6 - 20 mg/dL   Creatinine, Ser 2.24 0.44 - 1.00 mg/dL   Calcium 8.7 (L) 8.9 - 10.3 mg/dL   Total Protein 6.8 6.5 - 8.1 g/dL   Albumin 2.9 (L) 3.5 - 5.0 g/dL   AST 33 15 - 41 U/L   ALT 46 (H) 0 - 44 U/L   Alkaline Phosphatase 78 38 - 126 U/L   Total Bilirubin 0.3 0.3 - 1.2 mg/dL   GFR, Estimated >82 >50 mL/min   Anion gap 8 5 - 15     MAU Course  Procedures  MDM 1232: DW Dr. Charlotta Newton, patient ok for DC home and FU in the office in one week for a blood pressure check. ALT is just above normal cut off, but would have to be 3x normal to consider admission etc. Start procardia and lasix today   Patient reports that she needs to call for her incision check and will call tomorrow when the office opens to schedule incision check and BP check.   Assessment and Plan   1. Hypertension affecting pregnancy, antepartum, unspecified trimester   2. Postpartum state    DC home Pre-eclampsia warning signs reviewed  RX: lasix 2mg  BID x 5 days, Procardia 30XL daily # 30  Return to MAU as needed FU with OB as planned   Follow-up Information     [facility] Follow up.   Contact information: [address]  [address] [place] [phone]                [phone] DNP, CNM  09/08/22  12:50 PM

## 2022-09-08 NOTE — MAU Note (Signed)
Rachel Roy is a 36 y.o. at Unknown here in MAU reporting: crying upon entering the room. Patient has infant with her. States her BP started to elevate a few days prior to delivery but did not need any medication for it. States she has had a headache for 4 days. States she has been crying an emotional every day since delivery. States her husband and other 2 children have the FLU and are quarantined at home so she has been caring for the new baby alone. States she has been taking motrin and took last does at 0830 this morning and it did not relieve the HA. BP reading at home this morning was 159/99,161/105, and 161/112.  States she passed a large clot yesterday but other than that her post-partum bleeding has appeared normal to her.  Pain score: 4- headache  Vitals:   09/08/22 1048  BP: (!) 143/96  Pulse: 80  Resp: 18  Temp: 98.4 F (36.9 C)      Lab orders placed from triage:  n/a

## 2022-09-11 LAB — BIRTH TISSUE RECOVERY COLLECTION (PLACENTA DONATION)

## 2022-09-12 ENCOUNTER — Telehealth (HOSPITAL_COMMUNITY): Payer: Self-pay

## 2022-09-12 NOTE — Telephone Encounter (Signed)
Patient did not answer phone call. Voicemail left for patient.   Rachel Roy [facility] 09/12/22,0938
